# Patient Record
Sex: Female | Born: 1975 | Race: White | Hispanic: No | Marital: Married | Smoking: Former smoker
Health system: Southern US, Community
[De-identification: ages and names within clinical notes are randomized; demographics above are authoritative.]

## PROBLEM LIST (undated history)

## (undated) DIAGNOSIS — G43909 Migraine, unspecified, not intractable, without status migrainosus: Secondary | ICD-10-CM

## (undated) DIAGNOSIS — F419 Anxiety disorder, unspecified: Secondary | ICD-10-CM

## (undated) DIAGNOSIS — T7840XA Allergy, unspecified, initial encounter: Secondary | ICD-10-CM

## (undated) DIAGNOSIS — I499 Cardiac arrhythmia, unspecified: Secondary | ICD-10-CM

## (undated) HISTORY — PX: NO PAST SURGERIES: SHX2092

## (undated) HISTORY — DX: Allergy, unspecified, initial encounter: T78.40XA

## (undated) HISTORY — DX: Migraine, unspecified, not intractable, without status migrainosus: G43.909

## (undated) HISTORY — DX: Cardiac arrhythmia, unspecified: I49.9

## (undated) HISTORY — DX: Anxiety disorder, unspecified: F41.9

---

## 2019-07-09 DIAGNOSIS — Z111 Encounter for screening for respiratory tuberculosis: Secondary | ICD-10-CM | POA: Diagnosis not present

## 2019-07-11 DIAGNOSIS — Z111 Encounter for screening for respiratory tuberculosis: Secondary | ICD-10-CM | POA: Diagnosis not present

## 2019-07-24 ENCOUNTER — Other Ambulatory Visit: Payer: Self-pay | Admitting: Occupational Medicine

## 2019-07-24 ENCOUNTER — Other Ambulatory Visit: Payer: Self-pay

## 2019-07-24 ENCOUNTER — Ambulatory Visit: Payer: Self-pay

## 2019-07-24 DIAGNOSIS — Z0000 Encounter for general adult medical examination without abnormal findings: Secondary | ICD-10-CM

## 2019-07-24 LAB — BASIC METABOLIC PANEL
BUN: 12 (ref 4–21)
CO2: 24 — AB (ref 13–22)
Chloride: 107 (ref 99–108)
Creatinine: 0.7 (ref <=1.1)
Potassium: 4.3 (ref 3.4–5.3)
Sodium: 139 (ref 137–147)

## 2019-07-24 LAB — CBC AND DIFFERENTIAL
HCT: 38 (ref 36–46)
Hemoglobin: 12.6 (ref 12.0–16.0)
Neutrophils Absolute: 4866
Platelets: 284 (ref 150–399)
WBC: 7.9

## 2019-07-24 LAB — LIPID PANEL
Cholesterol: 110 (ref 0–200)
HDL: 52 (ref 35–70)
LDL Cholesterol: 46
Triglycerides: 50 (ref 40–160)

## 2019-07-24 LAB — COMPREHENSIVE METABOLIC PANEL
Albumin: 4.4 (ref 3.5–5.0)
Calcium: 9 (ref 8.7–10.7)
GFR calc Af Amer: 124
GFR calc non Af Amer: 107
Globulin: 2.6

## 2019-07-24 LAB — CBC: RBC: 4.41 (ref 3.87–5.11)

## 2019-09-15 DIAGNOSIS — Z20828 Contact with and (suspected) exposure to other viral communicable diseases: Secondary | ICD-10-CM | POA: Diagnosis not present

## 2019-09-25 ENCOUNTER — Ambulatory Visit (INDEPENDENT_AMBULATORY_CARE_PROVIDER_SITE_OTHER): Payer: BC Managed Care – PPO | Admitting: Family Medicine

## 2019-09-25 ENCOUNTER — Other Ambulatory Visit: Payer: Self-pay

## 2019-09-25 NOTE — Progress Notes (Deleted)
{Method of visit:23308}   Patient: Melissa Ho, Female    DOB: [birth year], 43 y.o.   MRN: [id number] Visit Date: 09/25/2019  Today's Provider: Lavon Paganini, MD   Chief Complaint  Patient presents with  . Establish Care   Subjective:     New patient establishing care: Melissa Ho is a 43 y.o. female who presents today to establish care. She feels fairly well. She reports exercising ***. She reports she is sleeping {DESC; WELL/FAIRLY WELL/POORLY:18703}.  -----------------------------------------------------------------   Review of Systems  Constitutional: Negative for chills, fatigue and fever.  HENT: Negative for congestion, ear pain, rhinorrhea, sneezing and sore throat.   Eyes: Negative.  Negative for pain and redness.  Respiratory: Negative for cough, shortness of breath and wheezing.   Cardiovascular: Negative for chest pain and leg swelling.  Gastrointestinal: Negative for abdominal pain, blood in stool, constipation, diarrhea and nausea.  Endocrine: Negative for polydipsia and polyphagia.  Genitourinary: Negative.  Negative for dysuria, flank pain, hematuria, pelvic pain, vaginal bleeding and vaginal discharge.  Musculoskeletal: Negative for arthralgias, back pain, gait problem and joint swelling.  Skin: Negative for rash.  Neurological: Negative.  Negative for dizziness, tremors, seizures, weakness, light-headedness, numbness and headaches.  Hematological: Negative for adenopathy.  Psychiatric/Behavioral: Negative.  Negative for behavioral problems, confusion and dysphoric mood. The patient is not nervous/anxious and is not hyperactive.     Social History      She         Social History   Socioeconomic History  . Marital status: Married    Spouse name: Not on file  . Number of children: Not on file  . Years of education: Not on file  . Highest education level: Not on file  Occupational History  . Not on file  Social Needs  . Financial resource  strain: Not on file  . Food insecurity    Worry: Not on file    Inability: Not on file  . Transportation needs    Medical: Not on file    Non-medical: Not on file  Tobacco Use  . Smoking status: Not on file  Substance and Sexual Activity  . Alcohol use: Not on file  . Drug use: Not on file  . Sexual activity: Not on file  Lifestyle  . Physical activity    Days per week: Not on file    Minutes per session: Not on file  . Stress: Not on file  Relationships  . Social Herbalist on phone: Not on file    Gets together: Not on file    Attends religious service: Not on file    Active member of club or organization: Not on file    Attends meetings of clubs or organizations: Not on file    Relationship status: Not on file  Other Topics Concern  . Not on file  Social History Narrative  . Not on file    No past medical history on file.   There are no active problems to display for this patient.   *** The histories are not reviewed yet. Please review them in the "History" navigator section and refresh this [place].  Family History        No family status information on file.        Her family history is not on file.      Not on File  No current outpatient medications on file.   Patient Care Team: Virginia Crews, MD as PCP -  General (Family Medicine)    Objective:    Vitals: There were no vitals taken for this visit.  There were no vitals filed for this visit.   Physical Exam   Depression Screen No flowsheet data found.     Assessment & Plan:     Routine Health Maintenance and Physical Exam  Exercise Activities and Dietary recommendations Goals   None      There is no immunization history on file for this patient.  Health Maintenance  Topic Date Due  . HIV Screening  12/17/1991  . TETANUS/TDAP  12/17/1995  . PAP SMEAR-Modifier  12/16/1997  . INFLUENZA VACCINE  06/07/2019     Discussed health benefits of physical activity,  and encouraged her to engage in regular exercise appropriate for her age and condition.    --------------------------------------------------------------------    Shirlee Latch, MD  [facility]

## 2019-09-26 NOTE — Progress Notes (Signed)
Patient rescheduled as she had COVID-like symptoms. Not seen today

## 2019-10-06 ENCOUNTER — Ambulatory Visit (INDEPENDENT_AMBULATORY_CARE_PROVIDER_SITE_OTHER): Payer: BC Managed Care – PPO | Admitting: Family Medicine

## 2019-10-06 ENCOUNTER — Encounter: Payer: Self-pay | Admitting: Family Medicine

## 2019-10-06 ENCOUNTER — Other Ambulatory Visit: Payer: Self-pay

## 2019-10-06 VITALS — BP 111/75 | HR 64 | Temp 97.3°F | Resp 16 | Ht 67.0 in | Wt 192.0 lb

## 2019-10-06 DIAGNOSIS — F411 Generalized anxiety disorder: Secondary | ICD-10-CM

## 2019-10-06 DIAGNOSIS — R911 Solitary pulmonary nodule: Secondary | ICD-10-CM | POA: Insufficient documentation

## 2019-10-06 DIAGNOSIS — Z1231 Encounter for screening mammogram for malignant neoplasm of breast: Secondary | ICD-10-CM | POA: Diagnosis not present

## 2019-10-06 DIAGNOSIS — Z23 Encounter for immunization: Secondary | ICD-10-CM

## 2019-10-06 DIAGNOSIS — J301 Allergic rhinitis due to pollen: Secondary | ICD-10-CM

## 2019-10-06 DIAGNOSIS — E669 Obesity, unspecified: Secondary | ICD-10-CM | POA: Insufficient documentation

## 2019-10-06 DIAGNOSIS — R002 Palpitations: Secondary | ICD-10-CM

## 2019-10-06 DIAGNOSIS — Z803 Family history of malignant neoplasm of breast: Secondary | ICD-10-CM

## 2019-10-06 DIAGNOSIS — E785 Hyperlipidemia, unspecified: Secondary | ICD-10-CM

## 2019-10-06 DIAGNOSIS — G4700 Insomnia, unspecified: Secondary | ICD-10-CM

## 2019-10-06 DIAGNOSIS — Z6830 Body mass index (BMI) 30.0-30.9, adult: Secondary | ICD-10-CM

## 2019-10-06 DIAGNOSIS — G43709 Chronic migraine without aura, not intractable, without status migrainosus: Secondary | ICD-10-CM

## 2019-10-06 HISTORY — DX: Solitary pulmonary nodule: R91.1

## 2019-10-06 MED ORDER — ZOLPIDEM TARTRATE 5 MG PO TABS: 2.5000 mg | ORAL_TABLET | Freq: Every evening | ORAL | 5 refills | Status: DC | PRN

## 2019-10-06 NOTE — Assessment & Plan Note (Signed)
Chronic and stable Continue Flonase and Allegra Discussed risks of rebound congestion with long-term decongestant use and advised using plain Allegra and not Allegra-D

## 2019-10-06 NOTE — Assessment & Plan Note (Signed)
Chronic and well-controlled Continue low-dose Celexa 10 mg daily When her stress stabilizes, we will attempt to taper off in the future

## 2019-10-06 NOTE — Assessment & Plan Note (Signed)
Reviewed recent lipid panel which showed good control Continue atorvastatin 40 mg daily, especially in the setting of her father's early cardiac history

## 2019-10-06 NOTE — Assessment & Plan Note (Signed)
Chronic and fairly well controlled Discussed side effects and risks of long-term Ambien use She will continue to try to wean and we will try to stop in the future when stress has settled down

## 2019-10-06 NOTE — Assessment & Plan Note (Signed)
Discussed importance of healthy weight management Discussed diet and exercise  

## 2019-10-06 NOTE — Assessment & Plan Note (Signed)
Chronic and intermittent Unclear what arrhythmia was seen on her EKG, but will request records from previous PCP Patient has had a benign stress test and cardiac evaluation Continue propranolol

## 2019-10-06 NOTE — Progress Notes (Signed)
Patient: Melissa Ho, Female    DOB: 07-26-1976, 43 y.o.   MRN: [id number] Visit Date: 10/06/2019  Today's Provider: Lavon Paganini, MD   Chief Complaint  Patient presents with   New Patient (Initial Visit)   Subjective:     Establish care Melissa Ho is a 43 y.o. female who presents today to establish care and for health maintenance . She feels well. She reports she is sleeping fairly well. Patient reports she moved from the state of [place] in July of this year. Patient reports last pap was done in 06/2018 and reports it was normal. Patient reports Tdap was given in March of 2020 due to cat scratch. Patient reports last mammogram was done 06/2018.  -----------------------------------------------------------------  Patient's mother had breast cancer at age 45.  Patient has had negative BRCA testing.  She does get mammograms annually and is due for one now.  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  She was noted to have a 1.5 cm nodule in her left lower lobe on chest x-ray incidentally.  She does not remember having a previous chest x-ray before this.  She has not had any follow-up for this.  Migraines: Since starting propranolol for migraine prophylaxis, she has had significant decrease in her migraine symptoms.  Is been more than a year since her last migraine  History of irregular heartbeat/palpitations.  She reports it was caught on EKG by her previous PCP.  She was evaluated by cardiology and had a stress test and was told that it was benign.  Propranolol also helps with this  Allergic rhinitis-taking Flonase and Allegra-D regularly with good control of symptoms.  She is trying to get off of Allegra-D because she knows about the rebound congestion issue  Anxiety: Patient is taking Celexa 10 mg daily.  She would like to wean off of this.  She is feeling balance currently.  She is hesitant to wean at this  time due to the stress due to the current pandemic  Insomnia: Patient has been taking Ambien nightly chronically.  She is weaned down to 2.5 mg nightly.  She states she has no trouble falling asleep, but difficulty staying asleep.  She is hoping to wean off of her Ambien in the future as well.  She does not feel like this is the right time to do it, however, due to the stress of the current pandemic.   Review of Systems  Constitutional: Negative.   HENT: Negative.   Eyes: Negative.   Respiratory: Negative.   Cardiovascular: Negative.   Gastrointestinal: Negative.   Endocrine: Negative.   Genitourinary: Negative.   Musculoskeletal: Positive for back pain.  Skin: Negative.   Allergic/Immunologic: Positive for environmental allergies.  Neurological: Negative.   Hematological: Negative.   Psychiatric/Behavioral: Negative.     Social History      She  reports that she quit smoking about 9 years ago. Her smoking use included cigarettes. She has never used smokeless tobacco. She reports current alcohol use of about 4.0 standard drinks of alcohol per week. She reports that she does not use drugs.       Social History   Socioeconomic History   Marital status: Married    Spouse name: Not on file   Number of children: Not on file   Years of education: Not on file   Highest education level: Not on file  Occupational History   Not on  file  Social Needs   Financial resource strain: Not on file   Food insecurity    Worry: Not on file    Inability: Not on file   Transportation needs    Medical: Not on file    Non-medical: Not on file  Tobacco Use   Smoking status: Former Smoker    Types: Cigarettes    Quit date: 09/06/2010    Years since quitting: 9.0   Smokeless tobacco: Never Used  Substance and Sexual Activity   Alcohol use: Yes    Alcohol/week: 4.0 standard drinks    Types: 4 Glasses of wine per week   Drug use: Never   Sexual activity: Yes  Lifestyle    Physical activity    Days per week: Not on file    Minutes per session: Not on file   Stress: Not on file  Relationships   Social connections    Talks on phone: Not on file    Gets together: Not on file    Attends religious service: Not on file    Active member of club or organization: Not on file    Attends meetings of clubs or organizations: Not on file    Relationship status: Not on file  Other Topics Concern   Not on file  Social History Narrative   Not on file    Past Medical History:  Diagnosis Date   Allergy    Anxiety    Irregular heartbeat    Migraine      There are no active problems to display for this patient.   History reviewed. No pertinent surgical history.  Family History        Family Status  Relation Name Status   Mother  Alive   Father  Alive   Brother  Alive        Her family history includes Breast cancer in her mother; Heart attack in her father; Stroke in her father.      No Known Allergies   Current Outpatient Medications:    atorvastatin (LIPITOR) 40 MG tablet, Take 40 mg by mouth at bedtime., Disp: , Rfl:    citalopram (CELEXA) 10 MG tablet, Take 10 mg by mouth daily., Disp: , Rfl:    fexofenadine (ALLEGRA) 180 MG tablet, Take 180 mg by mouth as needed for allergies or rhinitis., Disp: , Rfl:    fluticasone (FLONASE) 50 MCG/ACT nasal spray, Place 1 spray into both nostrils as needed for allergies or rhinitis., Disp: , Rfl:    propranolol (INDERAL) 10 MG tablet, Take 10 mg by mouth daily., Disp: , Rfl:    trimethoprim (TRIMPEX) 100 MG tablet, Take 100 mg by mouth as needed (after intercourse)., Disp: , Rfl:    zolpidem (AMBIEN) 5 MG tablet, Take 2.5 mg by mouth at bedtime as needed., Disp: , Rfl:    Patient Care Team: Virginia Crews, MD as PCP - General (Family Medicine)    Objective:    Vitals: Temp (!) 97.3 F (36.3 C) (Temporal)    Resp 16    Ht _0  (1.702 m)    Wt 192 lb (87.1 kg)    BMI 30.07 kg/m      Vitals:   10/06/19 1553  Resp: 16  Temp: (!) 97.3 F (36.3 C)  TempSrc: Temporal  Weight: 192 lb (87.1 kg)  Height: _1  (1.702 m)     Physical Exam Vitals signs reviewed.  Constitutional:      General: She is not in  acute distress.    Appearance: Normal appearance. She is well-developed. She is not diaphoretic.  HENT:     Head: Normocephalic and atraumatic.     Right Ear: Tympanic membrane, ear canal and external ear normal.     Left Ear: Tympanic membrane, ear canal and external ear normal.  Eyes:     General: No scleral icterus.    Conjunctiva/sclera: Conjunctivae normal.     Pupils: Pupils are equal, round, and reactive to light.  Neck:     Musculoskeletal: Neck supple.     Thyroid: No thyromegaly.  Cardiovascular:     Rate and Rhythm: Normal rate and regular rhythm.     Pulses: Normal pulses.     Heart sounds: Normal heart sounds. No murmur.  Pulmonary:     Effort: Pulmonary effort is normal. No respiratory distress.     Breath sounds: Normal breath sounds. No wheezing or rales.  Abdominal:     General: There is no distension.     Palpations: Abdomen is soft.     Tenderness: There is no abdominal tenderness.  Musculoskeletal:        General: No deformity.     Right lower leg: No edema.     Left lower leg: No edema.  Lymphadenopathy:     Cervical: No cervical adenopathy.  Skin:    General: Skin is warm and dry.     Capillary Refill: Capillary refill takes less than 2 seconds.     Findings: No rash.  Neurological:     Mental Status: She is alert and oriented to person, place, and time. Mental status is at baseline.  Psychiatric:        Mood and Affect: Mood normal.        Behavior: Behavior normal.        Thought Content: Thought content normal.      Depression Screen PHQ 2/9 Scores 10/06/2019  PHQ - 2 Score 0  PHQ- 9 Score 3       GAD 7 : Generalized Anxiety Score 10/06/2019 10/06/2019  Nervous, Anxious, on Edge 0 0  Control/stop worrying 0  0  Worry too much - different things 1 3  Trouble relaxing 0 0  Restless 0 0  Easily annoyed or irritable 0 0  Afraid - awful might happen 0 0  Total GAD 7 Score 1 3  Anxiety Difficulty Not difficult at all Not difficult at all      Assessment & Plan:     Establish care  Exercise Activities and Dietary recommendations Goals   None      There is no immunization history on file for this patient.  Health Maintenance  Topic Date Due   HIV Screening  12/17/1991   TETANUS/TDAP  12/17/1995   PAP SMEAR-Modifier  12/16/1997   INFLUENZA VACCINE  06/07/2019     Discussed health benefits of physical activity, and encouraged her to engage in regular exercise appropriate for her age and condition.    Personally reviewed recent chest x-ray and lab results  ROI sent to previous PCP for last labs, vaccines, and health maintenance --------------------------------------------------------------------  Problem List Items Addressed This Visit      Cardiovascular and Mediastinum   Chronic migraine without aura without status migrainosus, not intractable    Chronic Stable and well-controlled Continue propranolol for prophylaxis      Relevant Medications   atorvastatin (LIPITOR) 40 MG tablet   citalopram (CELEXA) 10 MG tablet   propranolol (INDERAL) 10 MG tablet  Respiratory   Seasonal allergic rhinitis due to pollen    Chronic and stable Continue Flonase and Allegra Discussed risks of rebound congestion with long-term decongestant use and advised using plain Allegra and not Allegra-D        Other   Solitary pulmonary nodule - Primary    Noted incidentally on a chest x-ray in 07/2019 Patient does have 8-pack-year smoking history and quit in 2011 Unclear of the age of this nodule she has no previous chest x-rays to compare Will obtain chest CT to further evaluate      Relevant Orders   CT CHEST NODULE FOLLOW UP LOW DOSE W/O   Insomnia    Chronic and fairly well  controlled Discussed side effects and risks of long-term Ambien use She will continue to try to wean and we will try to stop in the future when stress has settled down      GAD (generalized anxiety disorder)    Chronic and well-controlled Continue low-dose Celexa 10 mg daily When her stress stabilizes, we will attempt to taper off in the future      Relevant Medications   citalopram (CELEXA) 10 MG tablet   Hyperlipidemia    Reviewed recent lipid panel which showed good control Continue atorvastatin 40 mg daily, especially in the setting of her father's early cardiac history      Relevant Medications   atorvastatin (LIPITOR) 40 MG tablet   propranolol (INDERAL) 10 MG tablet   Family history of breast cancer    Family history of early breast cancer in her mother Continue annual mammogram screening Patient is BRCA negative      Relevant Orders   MM 3D SCREEN BREAST BILATERAL   Palpitations    Chronic and intermittent Unclear what arrhythmia was seen on her EKG, but will request records from previous PCP Patient has had a benign stress test and cardiac evaluation Continue propranolol      Obesity    Discussed importance of healthy weight management Discussed diet and exercise        Other Visit Diagnoses    Encounter for screening mammogram for malignant neoplasm of breast       Relevant Orders   MM 3D SCREEN BREAST BILATERAL   Need for influenza vaccination       Relevant Orders   Flu Vaccine QUAD 36+ mos IM (Completed)       Return in about 6 months (around 04/04/2020) for chronic disease f/u.   The entirety of the information documented in the History of Present Illness, Review of Systems and Physical Exam were personally obtained by me. Portions of this information were initially documented by Lynford Humphrey, CMA and reviewed by me for thoroughness and accuracy.    Ashok Sawaya, Dionne Bucy, MD MPH [facility]

## 2019-10-06 NOTE — Assessment & Plan Note (Signed)
Noted incidentally on a chest x-ray in 07/2019 Patient does have 8-pack-year smoking history and quit in 2011 Unclear of the age of this nodule she has no previous chest x-rays to compare Will obtain chest CT to further evaluate

## 2019-10-06 NOTE — Assessment & Plan Note (Signed)
Family history of early breast cancer in her mother Continue annual mammogram screening Patient is BRCA negative 

## 2019-10-06 NOTE — Assessment & Plan Note (Signed)
Chronic Stable and well-controlled Continue propranolol for prophylaxis

## 2019-10-13 ENCOUNTER — Ambulatory Visit
Admission: RE | Admit: 2019-10-13 | Discharge: 2019-10-13 | Disposition: A | Discharge status: Home / Self Care | Payer: BC Managed Care – PPO | Source: Ambulatory Visit | Attending: Family Medicine | Admitting: Family Medicine

## 2019-10-13 DIAGNOSIS — Z803 Family history of malignant neoplasm of breast: Secondary | ICD-10-CM | POA: Diagnosis not present

## 2019-10-13 DIAGNOSIS — Z1231 Encounter for screening mammogram for malignant neoplasm of breast: Secondary | ICD-10-CM

## 2019-10-21 ENCOUNTER — Telehealth: Payer: Self-pay

## 2019-10-21 ENCOUNTER — Other Ambulatory Visit: Payer: Self-pay

## 2019-10-21 ENCOUNTER — Ambulatory Visit
Admission: RE | Admit: 2019-10-21 | Discharge: 2019-10-21 | Disposition: A | Discharge status: Home / Self Care | Payer: BC Managed Care – PPO | Source: Ambulatory Visit | Attending: Family Medicine | Admitting: Family Medicine

## 2019-10-21 DIAGNOSIS — R911 Solitary pulmonary nodule: Secondary | ICD-10-CM | POA: Diagnosis not present

## 2019-10-21 NOTE — Telephone Encounter (Signed)
-----   Message from Virginia Crews, MD sent at 10/21/2019 11:44 AM EST ----- L sided pulmonary nodule is a granuloma.  There is nothing concerning for a malignant process.  No follow-up imaging is needed.

## 2019-10-21 NOTE — Telephone Encounter (Signed)
Patient advised as below.  

## 2019-10-24 ENCOUNTER — Telehealth: Payer: Self-pay

## 2019-10-24 NOTE — Telephone Encounter (Signed)
Left message advising pt.  (Per DPR)  Thanks,   -Chala Gul  

## 2019-10-24 NOTE — Telephone Encounter (Signed)
-----   Message from Virginia Crews, MD sent at 10/24/2019  9:09 AM EST ----- Normal mammogram. Repeat in 1 yr

## 2020-04-12 ENCOUNTER — Ambulatory Visit (INDEPENDENT_AMBULATORY_CARE_PROVIDER_SITE_OTHER): Payer: BC Managed Care – PPO | Admitting: Family Medicine

## 2020-04-12 ENCOUNTER — Other Ambulatory Visit: Payer: Self-pay

## 2020-04-12 ENCOUNTER — Encounter: Payer: Self-pay | Admitting: Family Medicine

## 2020-04-12 VITALS — BP 116/68 | HR 64 | Temp 97.3°F | Wt 198.0 lb

## 2020-04-12 DIAGNOSIS — G4700 Insomnia, unspecified: Secondary | ICD-10-CM

## 2020-04-12 DIAGNOSIS — E785 Hyperlipidemia, unspecified: Secondary | ICD-10-CM

## 2020-04-12 DIAGNOSIS — G43709 Chronic migraine without aura, not intractable, without status migrainosus: Secondary | ICD-10-CM | POA: Diagnosis not present

## 2020-04-12 DIAGNOSIS — F411 Generalized anxiety disorder: Secondary | ICD-10-CM

## 2020-04-12 DIAGNOSIS — M25531 Pain in right wrist: Secondary | ICD-10-CM | POA: Insufficient documentation

## 2020-04-12 MED ORDER — ZOLPIDEM TARTRATE 5 MG PO TABS: 2.5000 mg | ORAL_TABLET | Freq: Every evening | ORAL | 5 refills | Status: DC | PRN

## 2020-04-12 MED ORDER — CITALOPRAM HYDROBROMIDE 20 MG PO TABS: 20.0000 mg | ORAL_TABLET | Freq: Every day | ORAL | 1 refills | Status: DC

## 2020-04-12 NOTE — Patient Instructions (Signed)
Carpal Tunnel Syndrome  Carpal tunnel syndrome is a condition that causes pain in your hand and arm. The carpal tunnel is a narrow area that is on the palm side of your wrist. Repeated wrist motion or certain diseases may cause swelling in the tunnel. This swelling can pinch the main nerve in the wrist (median nerve). What are the causes? This condition may be caused by:  Repeated wrist motions.  Wrist injuries.  Arthritis.  A sac of fluid (cyst) or abnormal growth (tumor) in the carpal tunnel.  Fluid buildup during pregnancy. Sometimes the cause is not known. What increases the risk? The following factors may make you more likely to develop this condition:  Having a job in which you move your wrist in the same way many times. This includes jobs like being a butcher or a cashier.  Being a woman.  Having other health conditions, such as: ? Diabetes. ? Obesity. ? A thyroid gland that is not active enough (hypothyroidism). ? Kidney failure. What are the signs or symptoms? Symptoms of this condition include:  A tingling feeling in your fingers.  Tingling or a loss of feeling (numbness) in your hand.  Pain in your entire arm. This pain may get worse when you bend your wrist and elbow for a long time.  Pain in your wrist that goes up your arm to your shoulder.  Pain that goes down into your palm or fingers.  A weak feeling in your hands. You may find it hard to grab and hold items. You may feel worse at night. How is this diagnosed? This condition is diagnosed with a medical history and physical exam. You may also have tests, such as:  Electromyogram (EMG). This test checks the signals that the nerves send to the muscles.  Nerve conduction study. This test checks how well signals pass through your nerves.  Imaging tests, such as X-rays, ultrasound, and MRI. These tests check for what might be the cause of your condition. How is this treated? This condition may be treated  with:  Lifestyle changes. You will be asked to stop or change the activity that caused your problem.  Doing exercise and activities that make bones and muscles stronger (physical therapy).  Learning how to use your hand again (occupational therapy).  Medicines for pain and swelling (inflammation). You may have injections in your wrist.  A wrist splint.  Surgery. Follow these instructions at home: If you have a splint:  Wear the splint as told by your doctor. Remove it only as told by your doctor.  Loosen the splint if your fingers: ? Tingle. ? Lose feeling (become numb). ? Turn cold and blue.  Keep the splint clean.  If the splint is not waterproof: ? Do not let it get wet. ? Cover it with a watertight covering when you take a bath or a shower. Managing pain, stiffness, and swelling   If told, put ice on the painful area: ? If you have a removable splint, remove it as told by your doctor. ? Put ice in a plastic bag. ? Place a towel between your skin and the bag. ? Leave the ice on for 20 minutes, 2-3 times per day. General instructions  Take over-the-counter and prescription medicines only as told by your doctor.  Rest your wrist from any activity that may cause pain. If needed, talk with your boss at work about changes that can help your wrist heal.  Do any exercises as told by your doctor,   physical therapist, or occupational therapist.  Keep all follow-up visits as told by your doctor. This is important. Contact a doctor if:  You have new symptoms.  Medicine does not help your pain.  Your symptoms get worse. Get help right away if:  You have very bad numbness or tingling in your wrist or hand. Summary  Carpal tunnel syndrome is a condition that causes pain in your hand and arm.  It is often caused by repeated wrist motions.  Lifestyle changes and medicines are used to treat this problem. Surgery may help in very bad cases.  Follow your doctor's  instructions about wearing a splint, resting your wrist, keeping follow-up visits, and calling for help. This information is not intended to replace advice given to you by your health care provider. Make sure you discuss any questions you have with your health care provider. Document Revised: 03/01/2018 Document Reviewed: 03/01/2018 Elsevier Patient Education  2020 [employer]  

## 2020-04-12 NOTE — Assessment & Plan Note (Signed)
Previously well controlled Continue statin Goal LDL < 100  

## 2020-04-12 NOTE — Assessment & Plan Note (Signed)
Chronic and stable.  Pt only reports one migraine since last visit, and it was allergy related.  Continue current medications.

## 2020-04-12 NOTE — Assessment & Plan Note (Addendum)
New problem. Likely secondary to Carpel Tunnel.  Pt reports bracing at bedtime helps significantly. Recommended icing at night, and Ibuprofen if needed. Advised pt to call if worsening or not improved.  Will refer to Ortho at that time.

## 2020-04-12 NOTE — Assessment & Plan Note (Signed)
Well controlled.  Pt continues to take Ambien 2.5mg  at bedtime.  Will try to wean off when anxiety is under control.

## 2020-04-12 NOTE — Assessment & Plan Note (Addendum)
Chronic and uncontrolled.  Pt reports having two panic attacks in the last few weeks.  Will increase Celexa to 20mg  a day.  Recheck in three months at her physical.

## 2020-04-12 NOTE — Progress Notes (Signed)
I,Laura E Walsh,acting as a scribe for Shirlee Latch, MD.,have documented all relevant documentation on the behalf of Shirlee Latch, MD,as directed by  Shirlee Latch, MD while in the presence of Shirlee Latch, MD.   Established patient visit   Patient: Melissa Ho   DOB: 01/08/1976   44 y.o. Female  MRN: [id number] Visit Date: 04/12/2020  Today's healthcare provider: Shirlee Latch, MD   Chief Complaint  Patient presents with  . Hyperlipidemia  . Insomnia  . Anxiety  . Wrist Pain    Right wrist.   Subjective    HPI   Lipid/Cholesterol, Follow-up  Last lipid panel Other pertinent labs  Lab Results  Component Value Date   CHOL 110 07/24/2019   HDL 52 07/24/2019   LDLCALC 46 07/24/2019   TRIG 50 07/24/2019   Lab Results  Component Value Date   PLT 284 07/24/2019     She was last seen for this 6 months ago.  Management since that visit includes no changes.  She reports excellent compliance with treatment. She is not having side effects.   Symptoms: No chest pain No chest pressure/discomfort  No dyspnea No lower extremity edema  No numbness or tingling of extremity No orthopnea  No palpitations No paroxysmal nocturnal dyspnea  No speech difficulty No syncope   Current diet: in general, a "healthy" diet    The ASCVD Risk score Denman George DC Jr., et al., 2013) failed to calculate for the following reasons:   The valid total cholesterol range is 130 to 320 mg/dL  --------------------------------------------------------------------------------------------------- Anxiety, Follow-up  She was last seen for anxiety 6 months ago. Changes made at last visit include no changes.   She reports excellent compliance with treatment. She reports excellent tolerance of treatment. She is not having side effects.   She feels her anxiety is Worse since last visit. Pt reports having a two panic attacks in the last two weeks.    Symptoms: No chest pain No  difficulty concentrating  No dizziness No fatigue  No feelings of losing control No insomnia  No irritable No palpitations  Yes panic attacks No racing thoughts  No shortness of breath No sweating  No tremors/shakes    GAD-7 Results GAD-7 Generalized Anxiety Disorder Screening Tool 04/12/2020 10/06/2019 10/06/2019  1. Feeling Nervous, Anxious, or on Edge 1 0 0  2. Not Being Able to Stop or Control Worrying 1 0 0  3. Worrying Too Much About Different Things 1 1 3   4. Trouble Relaxing 0 0 0  5. Being So Restless it's Hard To Sit Still 0 0 0  6. Becoming Easily Annoyed or Irritable 1 0 0  7. Feeling Afraid As If Something Awful Might Happen 0 0 0  Total GAD-7 Score 4 1 3   Difficulty At Work, Home, or Getting  Along With Others? Somewhat difficult Not difficult at all Not difficult at all    PHQ-9 Scores PHQ9 SCORE ONLY 04/12/2020 10/06/2019  PHQ-9 Total Score 3 3     R hand with intermittent numbness and pain.  Has family h/o carpal tunnel syndrome and started night bracing which is helping. ---------------------------------------------------------------------------------------------------  Patient Active Problem List   Diagnosis Date Noted  . Right wrist pain 04/12/2020  . Solitary pulmonary nodule 10/06/2019  . Insomnia 10/06/2019  . GAD (generalized anxiety disorder) 10/06/2019  . Hyperlipidemia 10/06/2019  . Family history of breast cancer 10/06/2019  . Seasonal allergic rhinitis due to pollen 10/06/2019  . Chronic migraine without aura without  status migrainosus, not intractable 10/06/2019  . Palpitations 10/06/2019  . Obesity 10/06/2019   Past Medical History:  Diagnosis Date  . Allergy   . Anxiety   . Irregular heartbeat   . Migraine    Social History   Tobacco Use  . Smoking status: Former Smoker    Packs/day: 0.50    Years: 16.00    Pack years: 8.00    Types: Cigarettes    Quit date: 09/06/2010    Years since quitting: 9.6  . Smokeless tobacco: Never Used    Substance Use Topics  . Alcohol use: Yes    Alcohol/week: 4.0 standard drinks    Types: 4 Glasses of wine per week  . Drug use: Never   No Known Allergies   Medications: Outpatient Medications Prior to Visit  Medication Sig  . atorvastatin (LIPITOR) 40 MG tablet Take 40 mg by mouth at bedtime.  . fluticasone (FLONASE) 50 MCG/ACT nasal spray Place 1 spray into both nostrils as needed for allergies or rhinitis.  Marland Kitchen propranolol (INDERAL) 10 MG tablet Take 10 mg by mouth daily.  Marland Kitchen trimethoprim (TRIMPEX) 100 MG tablet Take 100 mg by mouth as needed (after intercourse).  Marland Kitchen zolpidem (AMBIEN) 5 MG tablet Take 0.5 tablets (2.5 mg total) by mouth at bedtime as needed.  . [DISCONTINUED] citalopram (CELEXA) 10 MG tablet Take 10 mg by mouth daily.  . fexofenadine (ALLEGRA) 180 MG tablet Take 180 mg by mouth as needed for allergies or rhinitis.   No facility-administered medications prior to visit.    Review of Systems  Constitutional: Negative.   Respiratory: Negative.   Cardiovascular: Negative.   Gastrointestinal: Negative.   Musculoskeletal: Positive for arthralgias (Right wrist pain). Negative for back pain, gait problem, joint swelling, myalgias, neck pain and neck stiffness.  Neurological: Negative for dizziness, light-headedness and headaches.  Psychiatric/Behavioral: Negative for decreased concentration, dysphoric mood, self-injury, sleep disturbance and suicidal ideas. The patient is not nervous/anxious.     Last metabolic panel Lab Results  Component Value Date   NA 139 07/24/2019   K 4.3 07/24/2019   CL 107 07/24/2019   CO2 24 (A) 07/24/2019   BUN 12 07/24/2019   CREATININE 0.7 07/24/2019   GFRNONAA 107 07/24/2019   GFRAA 124 07/24/2019   CALCIUM 9.0 07/24/2019   ALBUMIN 4.4 07/24/2019   Last lipids Lab Results  Component Value Date   CHOL 110 07/24/2019   HDL 52 07/24/2019   LDLCALC 46 07/24/2019   TRIG 50 07/24/2019      Objective    BP 116/68 (BP Location:  Left Arm, Patient Position: Sitting, Cuff Size: Large)   Pulse 64   Temp (!) 97.3 F (36.3 C) (Temporal)   Wt 198 lb (89.8 kg)   SpO2 98%   BMI 31.01 kg/m    Physical Exam Vitals and nursing note reviewed.  Constitutional:      Appearance: Normal appearance.  HENT:     Head: Normocephalic and atraumatic.  Eyes:     Conjunctiva/sclera: Conjunctivae normal.  Cardiovascular:     Rate and Rhythm: Normal rate and regular rhythm.     Pulses: Normal pulses.     Heart sounds: Normal heart sounds. No murmur.  Pulmonary:     Effort: Pulmonary effort is normal. No respiratory distress.     Breath sounds: Normal breath sounds. No wheezing or rhonchi.  Abdominal:     Palpations: Abdomen is soft.     Tenderness: There is no abdominal tenderness.  Musculoskeletal:  Right wrist: No swelling, deformity, effusion or tenderness. Normal range of motion.     Left wrist: Normal.     Cervical back: Neck supple.     Right lower leg: No edema.     Left lower leg: No edema.     Comments: Negative Phalens and tinels  Lymphadenopathy:     Cervical: No cervical adenopathy.  Skin:    General: Skin is warm and dry.     Findings: No rash.  Neurological:     Mental Status: She is alert. Mental status is at baseline.  Psychiatric:        Mood and Affect: Mood normal.        Behavior: Behavior normal.        Thought Content: Thought content normal.       No results found for any visits on 04/12/20.  Assessment & Plan     Problem List Items Addressed This Visit      Cardiovascular and Mediastinum   Chronic migraine without aura without status migrainosus, not intractable - Primary    Chronic and stable.  Pt only reports one migraine since last visit, and it was allergy related.  Continue current medications.       Relevant Medications   citalopram (CELEXA) 20 MG tablet     Other   Insomnia    Well controlled.  Pt continues to take Ambien 2.5mg  at bedtime.  Will try to wean off  when anxiety is under control.        GAD (generalized anxiety disorder)    Chronic and uncontrolled.  Pt reports having two panic attacks in the last few weeks.  Will increase Celexa to 20mg  a day.  Recheck in three months at her physical.       Relevant Medications   citalopram (CELEXA) 20 MG tablet   Hyperlipidemia    Previously well controlled Continue statin Goal LDL < 100       Right wrist pain    New problem. Likely secondary to Carpel Tunnel.  Pt reports bracing at bedtime helps significantly. Recommended icing at night, and Ibuprofen if needed. Advised pt to call if worsening or not improved.  Will refer to Ortho at that time.            Return in about 3 months (around 07/13/2020) for CPE and Pap.      I, 09/12/2020, MD, have reviewed all documentation for this visit. The documentation on 04/12/20 for the exam, diagnosis, procedures, and orders are all accurate and complete.   Yuktha Kerchner, 06/12/20, MD, MPH [facility]

## 2020-06-14 ENCOUNTER — Telehealth: Payer: Self-pay | Admitting: Family Medicine

## 2020-07-05 ENCOUNTER — Other Ambulatory Visit: Payer: Self-pay

## 2020-07-05 ENCOUNTER — Encounter: Payer: Self-pay | Admitting: Family Medicine

## 2020-07-05 ENCOUNTER — Ambulatory Visit (INDEPENDENT_AMBULATORY_CARE_PROVIDER_SITE_OTHER): Payer: BC Managed Care – PPO | Admitting: Family Medicine

## 2020-07-05 VITALS — BP 95/65 | HR 67 | Temp 98.5°F | Ht 67.0 in | Wt 197.0 lb

## 2020-07-05 DIAGNOSIS — E785 Hyperlipidemia, unspecified: Secondary | ICD-10-CM | POA: Diagnosis not present

## 2020-07-05 DIAGNOSIS — Z0000 Encounter for general adult medical examination without abnormal findings: Secondary | ICD-10-CM | POA: Diagnosis not present

## 2020-07-05 DIAGNOSIS — Z23 Encounter for immunization: Secondary | ICD-10-CM | POA: Diagnosis not present

## 2020-07-05 DIAGNOSIS — Z6830 Body mass index (BMI) 30.0-30.9, adult: Secondary | ICD-10-CM

## 2020-07-05 DIAGNOSIS — Z1159 Encounter for screening for other viral diseases: Secondary | ICD-10-CM

## 2020-07-05 DIAGNOSIS — Z114 Encounter for screening for human immunodeficiency virus [HIV]: Secondary | ICD-10-CM

## 2020-07-05 DIAGNOSIS — N390 Urinary tract infection, site not specified: Secondary | ICD-10-CM

## 2020-07-05 DIAGNOSIS — G4700 Insomnia, unspecified: Secondary | ICD-10-CM

## 2020-07-05 DIAGNOSIS — E669 Obesity, unspecified: Secondary | ICD-10-CM

## 2020-07-05 MED ORDER — TRIMETHOPRIM 100 MG PO TABS: 100.0000 mg | ORAL_TABLET | ORAL | 3 refills | Status: DC | PRN

## 2020-07-05 MED ORDER — ATORVASTATIN CALCIUM 40 MG PO TABS: 40.0000 mg | ORAL_TABLET | Freq: Every day | ORAL | 3 refills | Status: DC

## 2020-07-05 MED ORDER — PROPRANOLOL HCL 10 MG PO TABS: 10.0000 mg | ORAL_TABLET | Freq: Every day | ORAL | 3 refills | Status: DC

## 2020-07-05 MED ORDER — ZOLPIDEM TARTRATE 5 MG PO TABS: 2.5000 mg | ORAL_TABLET | Freq: Every evening | ORAL | 5 refills | Status: DC | PRN

## 2020-07-05 NOTE — Patient Instructions (Signed)
Preventive Care 20-43 Years Old, Female Preventive care refers to visits with your health care provider and lifestyle choices that can promote health and wellness. This includes:  A yearly physical exam. This may also be called an annual well check.  Regular dental visits and eye exams.  Immunizations.  Screening for certain conditions.  Healthy lifestyle choices, such as eating a healthy diet, getting regular exercise, not using drugs or products that contain nicotine and tobacco, and limiting alcohol use. What can I expect for my preventive care visit? Physical exam Your health care provider will check your:  Height and weight. This may be used to calculate body mass index (BMI), which tells if you are at a healthy weight.  Heart rate and blood pressure.  Skin for abnormal spots. Counseling Your health care provider may ask you questions about your:  Alcohol, tobacco, and drug use.  Emotional well-being.  Home and relationship well-being.  Sexual activity.  Eating habits.  Work and work Statistician.  Method of birth control.  Menstrual cycle.  Pregnancy history. What immunizations do I need?  Influenza (flu) vaccine  This is recommended every year. Tetanus, diphtheria, and pertussis (Tdap) vaccine  You may need a Td booster every 10 years. Varicella (chickenpox) vaccine  You may need this if you have not been vaccinated. Human papillomavirus (HPV) vaccine  If recommended by your health care provider, you may need three doses over 6 months. Measles, mumps, and rubella (MMR) vaccine  You may need at least one dose of MMR. You may also need a second dose. Meningococcal conjugate (MenACWY) vaccine  One dose is recommended if you are age 75-21 years and a first-year college student living in a residence hall, or if you have one of several medical conditions. You may also need additional booster doses. Pneumococcal conjugate (PCV13) vaccine  You may need  this if you have certain conditions and were not previously vaccinated. Pneumococcal polysaccharide (PPSV23) vaccine  You may need one or two doses if you smoke cigarettes or if you have certain conditions. Hepatitis A vaccine  You may need this if you have certain conditions or if you travel or work in places where you may be exposed to hepatitis A. Hepatitis B vaccine  You may need this if you have certain conditions or if you travel or work in places where you may be exposed to hepatitis B. Haemophilus influenzae type b (Hib) vaccine  You may need this if you have certain conditions. You may receive vaccines as individual doses or as more than one vaccine together in one shot (combination vaccines). Talk with your health care provider about the risks and benefits of combination vaccines. What tests do I need?  Blood tests  Lipid and cholesterol levels. These may be checked every 5 years starting at age 33.  Hepatitis C test.  Hepatitis B test. Screening  Diabetes screening. This is done by checking your blood sugar (glucose) after you have not eaten for a while (fasting).  Sexually transmitted disease (STD) testing.  BRCA-related cancer screening. This may be done if you have a family history of breast, ovarian, tubal, or peritoneal cancers.  Pelvic exam and Pap test. This may be done every 3 years starting at age 76. Starting at age 102, this may be done every 5 years if you have a Pap test in combination with an HPV test. Talk with your health care provider about your test results, treatment options, and if necessary, the need for more tests.  Follow these instructions at home: Eating and drinking   Eat a diet that includes fresh fruits and vegetables, whole grains, lean protein, and low-fat dairy.  Take vitamin and mineral supplements as recommended by your health care provider.  Do not drink alcohol if: ? Your health care provider tells you not to drink. ? You are  pregnant, may be pregnant, or are planning to become pregnant.  If you drink alcohol: ? Limit how much you have to 0-1 drink a day. ? Be aware of how much alcohol is in your drink. In the [place] one drink equals one 12 oz bottle of beer (355 mL), one 5 oz glass of wine (148 mL), or one 1 oz glass of hard liquor (44 mL). Lifestyle  Take daily care of your teeth and gums.  Stay active. Exercise for at least 30 minutes on 5 or more days each week.  Do not use any products that contain nicotine or tobacco, such as cigarettes, e-cigarettes, and chewing tobacco. If you need help quitting, ask your health care provider.  If you are sexually active, practice safe sex. Use a condom or other form of birth control (contraception) in order to prevent pregnancy and STIs (sexually transmitted infections). If you plan to become pregnant, see your health care provider for a preconception visit. What's next?  Visit your health care provider once a year for a well check visit.  Ask your health care provider how often you should have your eyes and teeth checked.  Stay up to date on all vaccines. This information is not intended to replace advice given to you by your health care provider. Make sure you discuss any questions you have with your health care provider. Document Revised: 07/04/2018 Document Reviewed: 07/04/2018 Elsevier Patient Education  2020 [employer].

## 2020-07-05 NOTE — Progress Notes (Signed)
I,Laura E Walsh,acting as a scribe for Shirlee Latch, MD.,have documented all relevant documentation on the behalf of Shirlee Latch, MD,as directed by  Shirlee Latch, MD while in the presence of Shirlee Latch, MD.  Complete physical exam   Patient: Melissa Ho   DOB: [birth year]   44 y.o. Female  MRN: [id number] Visit Date: 07/05/2020  Today's healthcare provider: Shirlee Latch, MD   Chief Complaint  Patient presents with  . Annual Exam   Subjective    Melissa Ho is a 44 y.o. female who presents today for a complete physical exam.  She reports consuming a general diet. Exercises regularly  She generally feels well. She reports sleeping fairly well. She does not have additional problems to discuss today.  HPI    Past Medical History:  Diagnosis Date  . Allergy   . Anxiety   . Irregular heartbeat   . Migraine    Past Surgical History:  Procedure Laterality Date  . NO PAST SURGERIES     Social History   Socioeconomic History  . Marital status: Married    Spouse name: Not on file  . Number of children: 0  . Years of education: Not on file  . Highest education level: Not on file  Occupational History  . Occupation: Research officer, political party  Tobacco Use  . Smoking status: Former Smoker    Packs/day: 0.50    Years: 16.00    Pack years: 8.00    Types: Cigarettes    Quit date: 09/06/2010    Years since quitting: 9.8  . Smokeless tobacco: Never Used  Vaping Use  . Vaping Use: Never used  Substance and Sexual Activity  . Alcohol use: Yes    Alcohol/week: 4.0 standard drinks    Types: 4 Glasses of wine per week  . Drug use: Never  . Sexual activity: Yes    Partners: Male    Birth control/protection: None    Comment: husband had vasectomy  Other Topics Concern  . Not on file  Social History Narrative  . Not on file   Social Determinants of Health   Financial Resource Strain:   . Difficulty of Paying Living Expenses: Not on file  Food  Insecurity:   . Worried About Programme researcher, broadcasting/film/video in the Last Year: Not on file  . Ran Out of Food in the Last Year: Not on file  Transportation Needs:   . Lack of Transportation (Medical): Not on file  . Lack of Transportation (Non-Medical): Not on file  Physical Activity:   . Days of Exercise per Week: Not on file  . Minutes of Exercise per Session: Not on file  Stress:   . Feeling of Stress : Not on file  Social Connections:   . Frequency of Communication with Friends and Family: Not on file  . Frequency of Social Gatherings with Friends and Family: Not on file  . Attends Religious Services: Not on file  . Active Member of Clubs or Organizations: Not on file  . Attends Banker Meetings: Not on file  . Marital Status: Not on file  Intimate Partner Violence:   . Fear of Current or Ex-Partner: Not on file  . Emotionally Abused: Not on file  . Physically Abused: Not on file  . Sexually Abused: Not on file   Family Status  Relation Name Status  . Mother  Alive  . Father  Alive  . Brother  Alive  . Other  (Not Specified)  .  Cousin pat Alive  . Neg Hx  (Not Specified)   Family History  Problem Relation Age of Onset  . Breast cancer Mother 34       x's 2  . Heart attack Father        thought to be related to agent orange exposure  . Stroke Father   . Polymyalgia rheumatica Father   . Leukemia Father        LGL T-Cell  . Lung cancer Other        paternal uncle x2, smokers  . Breast cancer Cousin   . Colon cancer Neg Hx    No Known Allergies  Patient Care Team: Erasmo Downer, MD as PCP - General (Family Medicine)   Medications: Outpatient Medications Prior to Visit  Medication Sig  . citalopram (CELEXA) 20 MG tablet Take 1 tablet (20 mg total) by mouth daily.  . fluticasone (FLONASE) 50 MCG/ACT nasal spray Place 1 spray into both nostrils as needed for allergies or rhinitis.  . [DISCONTINUED] atorvastatin (LIPITOR) 40 MG tablet Take 40 mg by mouth  at bedtime.  . [DISCONTINUED] propranolol (INDERAL) 10 MG tablet Take 10 mg by mouth daily.  . [DISCONTINUED] trimethoprim (TRIMPEX) 100 MG tablet Take 100 mg by mouth as needed (after intercourse).  . [DISCONTINUED] zolpidem (AMBIEN) 5 MG tablet Take 0.5 tablets (2.5 mg total) by mouth at bedtime as needed.   No facility-administered medications prior to visit.    Review of Systems  Constitutional: Negative.   HENT: Negative.   Eyes: Negative.   Respiratory: Negative.   Cardiovascular: Negative.   Gastrointestinal: Negative.   Endocrine: Negative.   Genitourinary: Negative.   Musculoskeletal: Positive for back pain. Negative for arthralgias, gait problem, joint swelling, myalgias, neck pain and neck stiffness.  Skin: Negative.   Allergic/Immunologic: Positive for environmental allergies. Negative for food allergies and immunocompromised state.  Neurological: Negative.   Hematological: Negative.   Psychiatric/Behavioral: Negative.       Objective    BP 95/65 (BP Location: Left Arm, Patient Position: Sitting, Cuff Size: Large)   Pulse 67   Temp 98.5 F (36.9 C) (Oral)   Ht 5\' 7"  (1.702 m)   Wt 197 lb (89.4 kg)   LMP 07/05/2020   BMI 30.85 kg/m    Physical Exam Constitutional:      Appearance: Normal appearance.  HENT:     Head: Normocephalic and atraumatic.     Right Ear: Tympanic membrane, ear canal and external ear normal.     Left Ear: Tympanic membrane, ear canal and external ear normal.  Eyes:     Extraocular Movements: Extraocular movements intact.     Conjunctiva/sclera: Conjunctivae normal.     Pupils: Pupils are equal, round, and reactive to light.  Cardiovascular:     Rate and Rhythm: Normal rate and regular rhythm.     Pulses: Normal pulses.     Heart sounds: Normal heart sounds.  Pulmonary:     Effort: Pulmonary effort is normal.     Breath sounds: Normal breath sounds.  Abdominal:     Palpations: Abdomen is soft.     Tenderness: There is no  abdominal tenderness.  Musculoskeletal:        General: No tenderness.     Cervical back: Neck supple. No tenderness.     Right lower leg: No edema.     Left lower leg: No edema.  Lymphadenopathy:     Cervical: No cervical adenopathy.  Skin:    General:  Skin is warm and dry.  Neurological:     Mental Status: She is alert and oriented to person, place, and time. Mental status is at baseline.  Psychiatric:        Mood and Affect: Mood normal.        Behavior: Behavior normal.        Thought Content: Thought content normal.        Judgment: Judgment normal.       Last depression screening scores PHQ 2/9 Scores 07/05/2020 04/12/2020 10/06/2019  PHQ - 2 Score 0 0 0  PHQ- 9 Score 1 3 3    Last fall risk screening Fall Risk  07/05/2020  Falls in the past year? 0  Number falls in past yr: 0  Injury with Fall? 0  Follow up Falls evaluation completed   Last Audit-C alcohol use screening Alcohol Use Disorder Test (AUDIT) 07/05/2020  1. How often do you have a drink containing alcohol? 3  2. How many drinks containing alcohol do you have on a typical day when you are drinking? 0  3. How often do you have six or more drinks on one occasion? 0  AUDIT-C Score 3  Alcohol Brief Interventions/Follow-up -   A score of 3 or more in women, and 4 or more in men indicates increased risk for alcohol abuse, EXCEPT if all of the points are from question 1   No results found for any visits on 07/05/20.  Assessment & Plan    Routine Health Maintenance and Physical Exam  Exercise Activities and Dietary recommendations Goals   None     Immunization History  Administered Date(s) Administered  . Influenza,inj,Quad PF,6+ Mos 10/06/2019, 07/05/2020  . PFIZER SARS-COV-2 Vaccination 01/13/2020, 02/03/2020  . PPD Test 07/09/2019, 06/23/2020    Health Maintenance  Topic Date Due  . Hepatitis C Screening  Never done  . HIV Screening  Never done  . TETANUS/TDAP  Never done  . PAP SMEAR-Modifier   01/13/2002  . INFLUENZA VACCINE  Completed  . COVID-19 Vaccine  Completed    Discussed health benefits of physical activity, and encouraged her to engage in regular exercise appropriate for her age and condition.  Problem List Items Addressed This Visit      Other   Hyperlipidemia    Previously well controlled Continue statin Repeat FLP and CMP Goal LDL < 100       Relevant Medications   atorvastatin (LIPITOR) 40 MG tablet   propranolol (INDERAL) 10 MG tablet   Other Relevant Orders   Lipid panel   Comprehensive metabolic panel   CBC with Differential/Platelet   Annual physical exam - Primary   Relevant Orders   Lipid panel   HIV antibody (with reflex)   Hepatitis C Antibody   Comprehensive metabolic panel   CBC with Differential/Platelet    Other Visit Diagnoses    Encounter for screening for HIV       Relevant Orders   HIV antibody (with reflex)   Need for hepatitis C screening test       Relevant Orders   Hepatitis C Antibody   Need for influenza vaccination       Relevant Orders   Flu Vaccine QUAD 6+ mos PF IM (Fluarix Quad PF) (Completed)     ROI sent again to [facility] for vaccines and pap smear records   Return in about 6 months (around 01/03/2021) for Chronic Illnesses.     I, Shirlee LatchAngela Jondavid Schreier, MD, have reviewed all documentation for  this visit. The documentation on 07/06/20 for the exam, diagnosis, procedures, and orders are all accurate and complete.   Adaisha Campise, Marzella Schlein, MD, MPH [facility]

## 2020-07-05 NOTE — Assessment & Plan Note (Signed)
Previously well controlled Continue statin Repeat FLP and CMP Goal LDL < 100 

## 2020-07-06 DIAGNOSIS — N390 Urinary tract infection, site not specified: Secondary | ICD-10-CM | POA: Insufficient documentation

## 2020-07-06 DIAGNOSIS — Z1159 Encounter for screening for other viral diseases: Secondary | ICD-10-CM | POA: Diagnosis not present

## 2020-07-06 DIAGNOSIS — Z0000 Encounter for general adult medical examination without abnormal findings: Secondary | ICD-10-CM | POA: Diagnosis not present

## 2020-07-06 DIAGNOSIS — Z114 Encounter for screening for human immunodeficiency virus [HIV]: Secondary | ICD-10-CM | POA: Diagnosis not present

## 2020-07-06 DIAGNOSIS — E785 Hyperlipidemia, unspecified: Secondary | ICD-10-CM | POA: Diagnosis not present

## 2020-07-06 NOTE — Assessment & Plan Note (Signed)
Well controlled Continue low dose ambien prn

## 2020-07-06 NOTE — Assessment & Plan Note (Signed)
History of recurrent UTIs postcoital Now well controlled Continue trimethoprim prn postcoitally

## 2020-07-06 NOTE — Assessment & Plan Note (Signed)
Discussed importance of healthy weight management Discussed diet and exercise  

## 2020-07-07 ENCOUNTER — Telehealth: Payer: Self-pay

## 2020-07-07 LAB — CBC WITH DIFFERENTIAL/PLATELET
Basophils Absolute: 0.1 10*3/uL (ref 0.0–0.2)
Basos: 1 %
EOS (ABSOLUTE): 0.1 10*3/uL (ref 0.0–0.4)
Eos: 2 %
Hematocrit: 39.3 % (ref 34.0–46.6)
Hemoglobin: 13.3 g/dL (ref 11.1–15.9)
Immature Grans (Abs): 0 10*3/uL (ref 0.0–0.1)
Immature Granulocytes: 0 %
Lymphocytes Absolute: 1.2 10*3/uL (ref 0.7–3.1)
Lymphs: 20 %
MCH: 28.7 pg (ref 26.6–33.0)
MCHC: 33.8 g/dL (ref 31.5–35.7)
MCV: 85 fL (ref 79–97)
Monocytes Absolute: 0.5 10*3/uL (ref 0.1–0.9)
Monocytes: 8 %
Neutrophils Absolute: 4 10*3/uL (ref 1.4–7.0)
Neutrophils: 69 %
Platelets: 238 10*3/uL (ref 150–450)
RBC: 4.64 x10E6/uL (ref 3.77–5.28)
RDW: 12 % (ref 11.7–15.4)
WBC: 5.9 10*3/uL (ref 3.4–10.8)

## 2020-07-07 LAB — COMPREHENSIVE METABOLIC PANEL
ALT: 11 IU/L (ref 0–32)
AST: 14 IU/L (ref 0–40)
Albumin/Globulin Ratio: 1.9 (ref 1.2–2.2)
Albumin: 4.5 g/dL (ref 3.8–4.8)
Alkaline Phosphatase: 83 IU/L (ref 48–121)
BUN/Creatinine Ratio: 18 (ref 9–23)
BUN: 14 mg/dL (ref 6–24)
Bilirubin Total: 0.6 mg/dL (ref 0.0–1.2)
CO2: 19 mmol/L — ABNORMAL LOW (ref 20–29)
Calcium: 8.8 mg/dL (ref 8.7–10.2)
Chloride: 105 mmol/L (ref 96–106)
Creatinine, Ser: 0.77 mg/dL (ref 0.57–1.00)
GFR calc Af Amer: 109 mL/min/{1.73_m2} (ref >59)
GFR calc non Af Amer: 95 mL/min/{1.73_m2} (ref >59)
Globulin, Total: 2.4 g/dL (ref 1.5–4.5)
Glucose: 90 mg/dL (ref 65–99)
Potassium: 4.6 mmol/L (ref 3.5–5.2)
Sodium: 138 mmol/L (ref 134–144)
Total Protein: 6.9 g/dL (ref 6.0–8.5)

## 2020-07-07 LAB — LIPID PANEL
Chol/HDL Ratio: 2.1 ratio (ref 0.0–4.4)
Cholesterol, Total: 116 mg/dL (ref 100–199)
HDL: 54 mg/dL (ref >39)
LDL Chol Calc (NIH): 52 mg/dL (ref 0–99)
Triglycerides: 40 mg/dL (ref 0–149)
VLDL Cholesterol Cal: 10 mg/dL (ref 5–40)

## 2020-07-07 LAB — HEPATITIS C ANTIBODY: Hep C Virus Ab: <0.1 s/co ratio (ref 0.0–0.9)

## 2020-07-07 LAB — HIV ANTIBODY (ROUTINE TESTING W REFLEX): HIV Screen 4th Generation wRfx: NONREACTIVE

## 2020-07-07 NOTE — Telephone Encounter (Signed)
LMTCB, [facility] Triage Nurse may give patient results  

## 2020-07-07 NOTE — Telephone Encounter (Signed)
-----   Message from Erasmo Downer, MD sent at 07/07/2020  8:11 AM EDT ----- Normal labs

## 2020-07-07 NOTE — Telephone Encounter (Signed)
Reviewed lab results and physician's note with the patient. No further questions. 

## 2020-08-10 ENCOUNTER — Telehealth: Payer: Self-pay

## 2020-08-10 NOTE — Telephone Encounter (Signed)
Copied from CRM [id number]. Topic: General - Other >> Aug 10, 2020  9:32 AM Dalphine Handing A wrote: Patient would like to schedule covid booster.

## 2020-08-12 NOTE — Telephone Encounter (Signed)
I am guessing it would be ok.  She is Obese and has high cholesterol. Those are comorbidities that should qualify her.

## 2020-08-20 ENCOUNTER — Ambulatory Visit: Payer: BC Managed Care – PPO

## 2020-10-04 ENCOUNTER — Other Ambulatory Visit: Payer: Self-pay | Admitting: Family Medicine

## 2020-10-07 ENCOUNTER — Other Ambulatory Visit: Payer: Self-pay | Admitting: Family Medicine

## 2020-10-07 MED ORDER — PROPRANOLOL HCL 10 MG PO TABS: 10.0000 mg | ORAL_TABLET | Freq: Every day | ORAL | 3 refills | Status: DC

## 2020-10-07 NOTE — Addendum Note (Signed)
Addended by: Lisabeth Pick on: 10/07/2020 01:25 PM   Modules accepted: Orders

## 2020-10-07 NOTE — Telephone Encounter (Signed)
PT need a refill  propranolol (INDERAL) 10 MG tablet [[id number]]  [id number] Judithann Sheen, [place] - [address]  [address] [place] [address]  Phone: [phone] Fax: [phone]

## 2020-10-25 ENCOUNTER — Other Ambulatory Visit: Payer: Self-pay | Admitting: Family Medicine

## 2020-10-26 NOTE — Telephone Encounter (Signed)
Requested medication (s) are due for refill today:  NO  Sent via Interface  Requested medication (s) are on the active medication list: yes  Last refill: 07/05/20  #15  5 refills  Future visit scheduled  Yes  01/03/21  Notes to clinic:  Not delegated  Requested Prescriptions  Pending Prescriptions Disp Refills   zolpidem (AMBIEN) 5 MG tablet [Pharmacy Med Name: ZOLPIDEM TARTRATE 5 MG TABLET] 15 tablet     Sig: Take 0.5 tablets (2.5 mg total) by mouth at bedtime as needed.      Not Delegated - Psychiatry:  Anxiolytics/Hypnotics Failed - 10/25/2020 10:44 PM      Failed - This refill cannot be delegated      Failed - Urine Drug Screen completed in last 360 days      Passed - Valid encounter within last 6 months    Recent Outpatient Visits           3 months ago Annual physical exam   [facility] [facility], Marzella Schlein, MD   6 months ago Chronic migraine without aura without status migrainosus, not intractable   [facility], Marzella Schlein, MD   1 year ago Solitary pulmonary nodule   [facility] [place], Marzella Schlein, MD   1 year ago Erroneous encounter - disregard   [facility], Marzella Schlein, MD       Future Appointments             In 2 months Bacigalupo, Marzella Schlein, MD [facility]   In 8 months Bacigalupo, Marzella Schlein, MD [facility]

## 2020-11-03 ENCOUNTER — Other Ambulatory Visit: Payer: Self-pay | Admitting: Family Medicine

## 2020-11-03 DIAGNOSIS — Z1231 Encounter for screening mammogram for malignant neoplasm of breast: Secondary | ICD-10-CM

## 2020-11-08 ENCOUNTER — Ambulatory Visit
Admission: RE | Admit: 2020-11-08 | Discharge: 2020-11-08 | Disposition: A | Discharge status: Home / Self Care | Payer: BC Managed Care – PPO | Source: Ambulatory Visit | Attending: Family Medicine | Admitting: Family Medicine

## 2020-11-08 ENCOUNTER — Other Ambulatory Visit: Payer: Self-pay

## 2020-11-08 DIAGNOSIS — Z1231 Encounter for screening mammogram for malignant neoplasm of breast: Secondary | ICD-10-CM | POA: Insufficient documentation

## 2021-01-03 ENCOUNTER — Telehealth (INDEPENDENT_AMBULATORY_CARE_PROVIDER_SITE_OTHER): Payer: BC Managed Care – PPO | Admitting: Family Medicine

## 2021-01-03 ENCOUNTER — Encounter: Payer: Self-pay | Admitting: Family Medicine

## 2021-01-03 VITALS — Ht 67.0 in | Wt 196.0 lb

## 2021-01-03 DIAGNOSIS — F411 Generalized anxiety disorder: Secondary | ICD-10-CM

## 2021-01-03 MED ORDER — TRIAMCINOLONE ACETONIDE 0.5 % EX OINT: 1.0000 "application " | TOPICAL_OINTMENT | Freq: Two times a day (BID) | CUTANEOUS | 5 refills | Status: DC

## 2021-01-03 MED ORDER — CITALOPRAM HYDROBROMIDE 20 MG PO TABS: 20.0000 mg | ORAL_TABLET | Freq: Every day | ORAL | 1 refills | Status: DC

## 2021-01-03 NOTE — Progress Notes (Signed)
MyChart Video Visit    Virtual Visit via Video Note   This visit type was conducted due to national recommendations for restrictions regarding the COVID-19 Pandemic (e.g. social distancing) in an effort to limit this patient's exposure and mitigate transmission in our community. This patient is at least at moderate risk for complications without adequate follow up. This format is felt to be most appropriate for this patient at this time. Physical exam was limited by quality of the video and audio technology used for the visit.    Patient location: home Provider location: [facility] Persons involved in the visit: patient, provider   I discussed the limitations of evaluation and management by telemedicine and the availability of in person appointments. The patient expressed understanding and agreed to proceed.  Patient: Melissa Ho   DOB: 04-Dec-1975   45 y.o. Female  MRN: [id number] Visit Date: 01/03/2021  Today's healthcare provider: Shirlee Latch, MD   Chief Complaint  Patient presents with  . Anxiety   Subjective    HPI  Anxiety, Follow-up  She was last seen for anxiety 6 months ago. Changes made at last visit include no changes.   She reports excellent compliance with treatment. She reports excellent tolerance of treatment. She is not having side effects.   She feels her anxiety is mild and Unchanged since last visit.  Symptoms: No chest pain No difficulty concentrating  No dizziness No fatigue  No feelings of losing control No insomnia  No irritable No palpitations  No panic attacks No racing thoughts  No shortness of breath No sweating  No tremors/shakes    GAD-7 Results GAD-7 Generalized Anxiety Disorder Screening Tool 01/03/2021 04/12/2020 10/06/2019  1. Feeling Nervous, Anxious, or on Edge 0 1 0  2. Not Being Able to Stop or Control Worrying 0 1 0  3. Worrying Too Much About Different Things 0 1 1  4. Trouble Relaxing 0 0 0  5. Being  So Restless it's Hard To Sit Still 0 0 0  6. Becoming Easily Annoyed or Irritable 0 1 0  7. Feeling Afraid As If Something Awful Might Happen 0 0 0  Total GAD-7 Score 0 4 1  Difficulty At Work, Home, or Getting  Along With Others? Not difficult at all Somewhat difficult Not difficult at all    PHQ-9 Scores PHQ9 SCORE ONLY 01/03/2021 07/05/2020 04/12/2020  PHQ-9 Total Score 2 1 3     ---------------------------------------------------------------------------------------------------   Patient Active Problem List   Diagnosis Date Noted  . Recurrent UTI 07/06/2020  . Annual physical exam 07/05/2020  . Right wrist pain 04/12/2020  . Solitary pulmonary nodule 10/06/2019  . Insomnia 10/06/2019  . GAD (generalized anxiety disorder) 10/06/2019  . Hyperlipidemia 10/06/2019  . Family history of breast cancer 10/06/2019  . Seasonal allergic rhinitis due to pollen 10/06/2019  . Chronic migraine without aura without status migrainosus, not intractable 10/06/2019  . Palpitations 10/06/2019  . Obesity 10/06/2019   Social History   Tobacco Use  . Smoking status: Former Smoker    Packs/day: 0.50    Years: 16.00    Pack years: 8.00    Types: Cigarettes    Quit date: 09/06/2010    Years since quitting: 10.3  . Smokeless tobacco: Never Used  Vaping Use  . Vaping Use: Never used  Substance Use Topics  . Alcohol use: Yes    Alcohol/week: 4.0 standard drinks    Types: 4 Glasses of wine per week  . Drug use: Never  No Known Allergies    Medications: Outpatient Medications Prior to Visit  Medication Sig  . atorvastatin (LIPITOR) 40 MG tablet Take 1 tablet (40 mg total) by mouth at bedtime.  . fluticasone (FLONASE) 50 MCG/ACT nasal spray Place 1 spray into both nostrils as needed for allergies or rhinitis.  Marland Kitchen propranolol (INDERAL) 10 MG tablet Take 1 tablet (10 mg total) by mouth daily.  Marland Kitchen trimethoprim (TRIMPEX) 100 MG tablet Take 1 tablet (100 mg total) by mouth as needed (after  intercourse).  Marland Kitchen zolpidem (AMBIEN) 5 MG tablet TAKE 0.5 TABLETS (2.5 MG TOTAL) BY MOUTH AT BEDTIME AS NEEDED.  . [DISCONTINUED] citalopram (CELEXA) 20 MG tablet TAKE 1 TABLET BY MOUTH EVERY DAY   No facility-administered medications prior to visit.    Review of Systems - per HPI  Last thyroid functions No results found for: TSH, T3TOTAL, T4TOTAL, THYROIDAB Last vitamin D No results found for: 25OHVITD2, 25OHVITD3, VD25OH Last vitamin B12 and Folate No results found for: VITAMINB12, FOLATE    Objective    Ht 5\' 7"  (1.702 m)   Wt 196 lb (88.9 kg)   LMP 12/22/2020 (Approximate)   BMI 30.70 kg/m  BP Readings from Last 3 Encounters:  07/05/20 95/65  04/12/20 116/68  10/06/19 111/75   Wt Readings from Last 3 Encounters:  01/03/21 196 lb (88.9 kg)  07/05/20 197 lb (89.4 kg)  04/12/20 198 lb (89.8 kg)      Physical Exam Constitutional:      General: She is not in acute distress. Eyes:     Conjunctiva/sclera: Conjunctivae normal.  Pulmonary:     Effort: Pulmonary effort is normal. No respiratory distress.  Neurological:     Mental Status: She is alert. Mental status is at baseline.  Psychiatric:        Mood and Affect: Mood normal.        Behavior: Behavior normal.        Assessment & Plan     Problem List Items Addressed This Visit      Other   GAD (generalized anxiety disorder) - Primary    Longstanding and well controlled Continue celexa at current dose Have discussed benefits of therapy F/u in 54m and repeat GAD7      Relevant Medications   citalopram (CELEXA) 20 MG tablet       Return in about 6 months (around 07/03/2021) for CPE, as scheduled.     I discussed the assessment and treatment plan with the patient. The patient was provided an opportunity to ask questions and all were answered. The patient agreed with the plan and demonstrated an understanding of the instructions.   The patient was advised to call back or seek an in-person evaluation if  the symptoms worsen or if the condition fails to improve as anticipated.  I, 07/05/2021, MD, have reviewed all documentation for this visit. The documentation on 01/03/21 for the exam, diagnosis, procedures, and orders are all accurate and complete.   Vikrant Pryce, 01/05/21, MD, MPH [facility]

## 2021-01-03 NOTE — Assessment & Plan Note (Signed)
Longstanding and well controlled Continue celexa at current dose Have discussed benefits of therapy F/u in 83m and repeat GAD7

## 2021-01-23 ENCOUNTER — Other Ambulatory Visit: Payer: Self-pay | Admitting: Family Medicine

## 2021-01-24 NOTE — Telephone Encounter (Signed)
Requested medication (s) are due for refill today: yes  Requested medication (s) are on the active medication list: yes  Last refill:  12/27/20  Future visit scheduled: yes  Notes to clinic:  not delegated    Requested Prescriptions  Pending Prescriptions Disp Refills   zolpidem (AMBIEN) 5 MG tablet [Pharmacy Med Name: ZOLPIDEM TARTRATE 5 MG TABLET] 15 tablet 2    Sig: TAKE 0.5 TABLETS (2.5 MG TOTAL) BY MOUTH AT BEDTIME AS NEEDED.      Not Delegated - Psychiatry:  Anxiolytics/Hypnotics Failed - 01/23/2021 10:29 PM      Failed - This refill cannot be delegated      Failed - Urine Drug Screen completed in last 360 days      Passed - Valid encounter within last 6 months    Recent Outpatient Visits           3 weeks ago GAD (generalized anxiety disorder)   [facility], Marzella Schlein, MD   6 months ago Annual physical exam   [facility] [place], Marzella Schlein, MD   9 months ago Chronic migraine without aura without status migrainosus, not intractable   [facility], Marzella Schlein, MD   1 year ago Solitary pulmonary nodule   [facility] [place], Marzella Schlein, MD   1 year ago Erroneous encounter - disregard   [facility], Marzella Schlein, MD       Future Appointments             In 5 months Bacigalupo, Marzella Schlein, MD [facility]

## 2021-04-25 ENCOUNTER — Other Ambulatory Visit: Payer: Self-pay | Admitting: Family Medicine

## 2021-04-25 NOTE — Telephone Encounter (Signed)
Requested medications are due for refill today yes  Requested medications are on the active medication list yes  Last refill 5/20  Last visit 12/2020  Future visit scheduled 07/2021  Notes to clinic Not Delegated.

## 2021-05-12 ENCOUNTER — Encounter: Payer: Self-pay | Admitting: Family Medicine

## 2021-05-17 ENCOUNTER — Encounter: Payer: Self-pay | Admitting: Family Medicine

## 2021-05-20 MED ORDER — AZITHROMYCIN 250 MG PO TABS: ORAL_TABLET | ORAL | 0 refills | Status: AC

## 2021-06-06 ENCOUNTER — Encounter: Payer: Self-pay | Admitting: Family Medicine

## 2021-06-06 ENCOUNTER — Ambulatory Visit (INDEPENDENT_AMBULATORY_CARE_PROVIDER_SITE_OTHER): Payer: Self-pay | Admitting: Family Medicine

## 2021-06-06 ENCOUNTER — Other Ambulatory Visit: Payer: Self-pay

## 2021-06-06 DIAGNOSIS — Y9315 Activity, underwater diving and snorkeling: Secondary | ICD-10-CM

## 2021-06-06 DIAGNOSIS — Z0189 Encounter for other specified special examinations: Secondary | ICD-10-CM

## 2021-06-06 DIAGNOSIS — Z8616 Personal history of COVID-19: Secondary | ICD-10-CM

## 2021-06-06 NOTE — Progress Notes (Signed)
PCP: Erasmo Downer, MD  Subjective:   HPI: Patient is a 45 y.o. female here for covid clearance for diving.  Patient reports she contracted Covid on 7/6. Had mostly head cold, secondary sinus infection, low grade temp (no higher than 99), slight cough. No chest pain, shortness of breath, palpitations. Has recovered without residual symptoms now.  Past Medical History:  Diagnosis Date   Allergy    Anxiety    Irregular heartbeat    Migraine     Current Outpatient Medications on File Prior to Visit  Medication Sig Dispense Refill   zolpidem (AMBIEN) 5 MG tablet TAKE 1/2 TABLET (2.5 MG TOTAL) BY MOUTH AT BEDTIME AS NEEDED. 15 tablet 2   atorvastatin (LIPITOR) 40 MG tablet Take 1 tablet (40 mg total) by mouth at bedtime. 90 tablet 3   citalopram (CELEXA) 20 MG tablet Take 1 tablet (20 mg total) by mouth daily. 90 tablet 1   fluticasone (FLONASE) 50 MCG/ACT nasal spray Place 1 spray into both nostrils as needed for allergies or rhinitis.     propranolol (INDERAL) 10 MG tablet Take 1 tablet (10 mg total) by mouth daily. 90 tablet 3   triamcinolone ointment (KENALOG) 0.5 % Apply 1 application topically 2 (two) times daily. 30 g 5   trimethoprim (TRIMPEX) 100 MG tablet Take 1 tablet (100 mg total) by mouth as needed (after intercourse). 30 tablet 3   No current facility-administered medications on file prior to visit.    Past Surgical History:  Procedure Laterality Date   NO PAST SURGERIES      No Known Allergies  Social History   Socioeconomic History   Marital status: Married    Spouse name: Not on file   Number of children: 0   Years of education: Not on file   Highest education level: Not on file  Occupational History   Occupation: Research officer, political party  Tobacco Use   Smoking status: Former    Packs/day: 0.50    Years: 16.00    Pack years: 8.00    Types: Cigarettes    Quit date: 09/06/2010    Years since quitting: 10.7   Smokeless tobacco: Never  Vaping Use    Vaping Use: Never used  Substance and Sexual Activity   Alcohol use: Yes    Alcohol/week: 4.0 standard drinks    Types: 4 Glasses of wine per week   Drug use: Never   Sexual activity: Yes    Partners: Male    Birth control/protection: None    Comment: husband had vasectomy  Other Topics Concern   Not on file  Social History Narrative   Not on file   Social Determinants of Health   Financial Resource Strain: Not on file  Food Insecurity: Not on file  Transportation Needs: Not on file  Physical Activity: Not on file  Stress: Not on file  Social Connections: Not on file  Intimate Partner Violence: Not on file    Family History  Problem Relation Age of Onset   Breast cancer Mother 67       x's 2   Heart attack Father        thought to be related to agent orange exposure   Stroke Father    Polymyalgia rheumatica Father    Leukemia Father        LGL T-Cell   Lung cancer Other        paternal uncle x2, smokers   Breast cancer Cousin    Colon  cancer Neg Hx     BP (!) 124/92   Ht 5\' 7"  (1.702 m)   Wt 197 lb (89.4 kg)   BMI 30.85 kg/m   No flowsheet data found.  No flowsheet data found.  Review of Systems: See HPI above.     Objective:  Physical Exam:  Gen: NAD, comfortable in exam room  CV: RRR no MRG Lungs: CTAB without wheezes, rales, rhonchi   Assessment & Plan:  1. Covid - symptoms mild without cardiopulmonary involvement.  Exam reassuring.  Cleared for diving without restrictions.

## 2021-07-03 ENCOUNTER — Other Ambulatory Visit: Payer: Self-pay | Admitting: Family Medicine

## 2021-07-03 NOTE — Telephone Encounter (Signed)
Requested Prescriptions  Pending Prescriptions Disp Refills  . citalopram (CELEXA) 20 MG tablet [Pharmacy Med Name: CITALOPRAM HBR 20 MG TABLET] 90 tablet 0    Sig: TAKE 1 TABLET BY MOUTH EVERY DAY     Psychiatry:  Antidepressants - SSRI Failed - 07/03/2021  6:33 AM      Failed - Valid encounter within last 6 months    Recent Outpatient Visits          6 months ago GAD (generalized anxiety disorder)   [facility], Marzella Schlein, MD   12 months ago Annual physical exam   [facility], Marzella Schlein, MD   1 year ago Chronic migraine without aura without status migrainosus, not intractable   [facility], Marzella Schlein, MD   1 year ago Solitary pulmonary nodule   [facility] [place], Marzella Schlein, MD   1 year ago Erroneous encounter - disregard   [facility], Marzella Schlein, MD      Future Appointments            In 1 week Bacigalupo, Marzella Schlein, MD [facility]

## 2021-07-12 ENCOUNTER — Other Ambulatory Visit: Payer: Self-pay

## 2021-07-12 ENCOUNTER — Ambulatory Visit (INDEPENDENT_AMBULATORY_CARE_PROVIDER_SITE_OTHER): Payer: BC Managed Care – PPO | Admitting: Family Medicine

## 2021-07-12 ENCOUNTER — Encounter: Payer: Self-pay | Admitting: Family Medicine

## 2021-07-12 ENCOUNTER — Other Ambulatory Visit (HOSPITAL_COMMUNITY)
Admission: RE | Admit: 2021-07-12 | Discharge: 2021-07-12 | Disposition: A | Discharge status: Home / Self Care | Payer: BC Managed Care – PPO | Source: Ambulatory Visit | Attending: Family Medicine | Admitting: Family Medicine

## 2021-07-12 VITALS — BP 110/75 | HR 66 | Temp 98.4°F | Ht 67.0 in | Wt 208.9 lb

## 2021-07-12 DIAGNOSIS — F411 Generalized anxiety disorder: Secondary | ICD-10-CM

## 2021-07-12 DIAGNOSIS — G4700 Insomnia, unspecified: Secondary | ICD-10-CM

## 2021-07-12 DIAGNOSIS — E785 Hyperlipidemia, unspecified: Secondary | ICD-10-CM | POA: Diagnosis not present

## 2021-07-12 DIAGNOSIS — Z124 Encounter for screening for malignant neoplasm of cervix: Secondary | ICD-10-CM | POA: Insufficient documentation

## 2021-07-12 DIAGNOSIS — E669 Obesity, unspecified: Secondary | ICD-10-CM

## 2021-07-12 DIAGNOSIS — Z0000 Encounter for general adult medical examination without abnormal findings: Secondary | ICD-10-CM

## 2021-07-12 DIAGNOSIS — M2170 Unequal limb length (acquired), unspecified site: Secondary | ICD-10-CM

## 2021-07-12 DIAGNOSIS — Z23 Encounter for immunization: Secondary | ICD-10-CM

## 2021-07-12 DIAGNOSIS — Z6832 Body mass index (BMI) 32.0-32.9, adult: Secondary | ICD-10-CM

## 2021-07-12 NOTE — Assessment & Plan Note (Signed)
Previously well controlled Continue statin Repeat FLP and CMP  

## 2021-07-12 NOTE — Assessment & Plan Note (Signed)
Chronic and stable Continue ambien prn

## 2021-07-12 NOTE — Assessment & Plan Note (Signed)
Discussed importance of healthy weight management Discussed diet and exercise  

## 2021-07-12 NOTE — Assessment & Plan Note (Signed)
Likely cause of intermittent back/SI joint soreness Consider SM referral in the future for possible shoe lift/custom orthotic

## 2021-07-12 NOTE — Assessment & Plan Note (Signed)
Chronic and well controlled Continue celexa at current dose 

## 2021-07-12 NOTE — Progress Notes (Signed)
Complete physical exam   Patient: Melissa Ho   DOB: [birth year]   45 y.o. Female  MRN: [id number] Visit Date: 07/12/2021  Today's healthcare provider: Shirlee Latch, MD   Chief Complaint  Patient presents with   Annual Exam   Subjective    Melissa Ho is a 45 y.o. female who presents today for a complete physical exam.  She reports consuming a general diet. The patient has a physically strenuous job, but has no regular exercise apart from work.  She generally feels well. She reports sleeping well. She doehave additional problems to discuss today.   HPI   Chronic Back Pain Patient states lower back pain on right side seems to be getting progressively worse and would like to discuss. 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 She sleeps with a pillow between her legs for relief. She declines shoe lifts at this time.   Medications She is tolerating and reports good compliance with all medicines. She denies any side affects.   Screenings  Mammogram 11/08/20 Pap-07/12/21  Vaccines  Tetanus-March 2020 Flu-09/06 COVID- she has received 3 vaccines    Past Medical History:  Diagnosis Date   Allergy    Anxiety    Irregular heartbeat    Migraine    Solitary pulmonary nodule 10/06/2019   Past Surgical History:  Procedure Laterality Date   NO PAST SURGERIES     Social History   Socioeconomic History   Marital status: Married    Spouse name: Not on file   Number of children: 0   Years of education: Not on file   Highest education level: Not on file  Occupational History   Occupation: Research officer, political party  Tobacco Use   Smoking status: Former    Packs/day: 0.50    Years: 16.00    Pack years: 8.00    Types: Cigarettes    Quit date: 09/06/2010    Years since quitting: 10.8   Smokeless tobacco: Never  Vaping Use   Vaping Use: Never used  Substance and Sexual Activity    Alcohol use: Yes    Alcohol/week: 4.0 standard drinks    Types: 4 Glasses of wine per week   Drug use: Never   Sexual activity: Yes    Partners: Male    Birth control/protection: None    Comment: husband had vasectomy  Other Topics Concern   Not on file  Social History Narrative   Not on file   Social Determinants of Health   Financial Resource Strain: Not on file  Food Insecurity: Not on file  Transportation Needs: Not on file  Physical Activity: Not on file  Stress: Not on file  Social Connections: Not on file  Intimate Partner Violence: Not on file   Family Status  Relation Name Status   Mother  Alive   Father  Alive   Brother  Alive   Other  (Not Specified)   Cousin pat Alive   Neg Hx  (Not Specified)   Family History  Problem Relation Age of Onset   Breast cancer Mother 29       x's 2   Heart attack Father        thought to be related to agent orange exposure   Stroke Father    Polymyalgia rheumatica Father    Leukemia Father        LGL T-Cell   Lung cancer  Other        paternal uncle x2, smokers   Breast cancer Cousin    Colon cancer Neg Hx    No Known Allergies  Patient Care Team: Erasmo DownerBacigalupo, Christon Gallaway M, MD as PCP - General (Family Medicine)   Medications: Outpatient Medications Prior to Visit  Medication Sig Note   atorvastatin (LIPITOR) 40 MG tablet Take 1 tablet (40 mg total) by mouth at bedtime.    citalopram (CELEXA) 20 MG tablet TAKE 1 TABLET BY MOUTH EVERY DAY    fluticasone (FLONASE) 50 MCG/ACT nasal spray Place 1 spray into both nostrils as needed for allergies or rhinitis.    propranolol (INDERAL) 10 MG tablet Take 1 tablet (10 mg total) by mouth daily.    trimethoprim (TRIMPEX) 100 MG tablet Take 1 tablet (100 mg total) by mouth as needed (after intercourse).    zolpidem (AMBIEN) 5 MG tablet TAKE 1/2 TABLET (2.5 MG TOTAL) BY MOUTH AT BEDTIME AS NEEDED.    triamcinolone ointment (KENALOG) 0.5 % Apply 1 application topically 2 (two) times  daily. (Patient not taking: Reported on 07/12/2021) 07/12/2021: Only uses for eczema when she has a flare up   No facility-administered medications prior to visit.    Review of Systems  Constitutional:  Negative for chills, diaphoresis, fatigue and fever.  HENT:  Negative for ear pain, sinus pressure, sinus pain and sore throat.   Eyes:  Negative for pain and visual disturbance.  Respiratory:  Negative for cough, chest tightness, shortness of breath and wheezing.   Cardiovascular:  Negative for chest pain, palpitations and leg swelling.  Gastrointestinal:  Negative for abdominal pain, blood in stool, constipation, diarrhea, nausea and vomiting.  Genitourinary:  Negative for dysuria, flank pain, frequency, pelvic pain and urgency.  Musculoskeletal:  Positive for back pain. Negative for myalgias and neck pain.  Allergic/Immunologic: Positive for environmental allergies.  Neurological:  Negative for dizziness, seizures, syncope, weakness, light-headedness, numbness and headaches.     Objective    BP 110/75 (BP Location: Right Arm, Patient Position: Sitting, Cuff Size: Normal)   Pulse 66   Temp 98.4 F (36.9 C) (Oral)   Ht 5\' 7"  (1.702 m)   Wt 208 lb 14.4 oz (94.8 kg)   LMP 07/06/2021 (Exact Date) Comment: last anywhere for 5-7 days  SpO2 96%   BMI 32.72 kg/m    Physical Exam Vitals reviewed.  Constitutional:      General: She is not in acute distress.    Appearance: Normal appearance. She is well-developed. She is not diaphoretic.  HENT:     Head: Normocephalic and atraumatic.     Right Ear: Tympanic membrane, ear canal and external ear normal.     Left Ear: Tympanic membrane, ear canal and external ear normal.     Nose: Nose normal.     Mouth/Throat:     Mouth: Mucous membranes are moist.     Pharynx: Oropharynx is clear. No oropharyngeal exudate.  Eyes:     General: No scleral icterus.    Conjunctiva/sclera: Conjunctivae normal.     Pupils: Pupils are equal, round, and  reactive to light.  Neck:     Thyroid: No thyromegaly.  Cardiovascular:     Rate and Rhythm: Normal rate and regular rhythm.     Pulses: Normal pulses.     Heart sounds: Normal heart sounds. No murmur heard. Pulmonary:     Effort: Pulmonary effort is normal. No respiratory distress.     Breath sounds: Normal breath sounds. No  wheezing or rales.  Abdominal:     General: There is no distension.     Palpations: Abdomen is soft.     Tenderness: There is no abdominal tenderness.  Musculoskeletal:        General: No deformity.     Cervical back: Neck supple. No tenderness.     Thoracic back: No tenderness.     Lumbar back: No tenderness.     Right lower leg: No edema.     Left lower leg: No edema.     Comments: Right hip higher than left   Lymphadenopathy:     Cervical: No cervical adenopathy.  Skin:    General: Skin is warm and dry.     Findings: No rash.  Neurological:     Mental Status: She is alert and oriented to person, place, and time. Mental status is at baseline.     Sensory: No sensory deficit.     Motor: No weakness.     Gait: Gait normal.  Psychiatric:        Mood and Affect: Mood normal.        Behavior: Behavior normal.        Thought Content: Thought content normal.      Last depression screening scores PHQ 2/9 Scores 07/12/2021 01/03/2021 07/05/2020  PHQ - 2 Score 0 0 0  PHQ- 9 Score 0 2 1   Last fall risk screening Fall Risk  07/12/2021  Falls in the past year? 0  Number falls in past yr: 0  Injury with Fall? 0  Risk for fall due to : No Fall Risks  Follow up -   Last Audit-C alcohol use screening Alcohol Use Disorder Test (AUDIT) 07/12/2021  1. How often do you have a drink containing alcohol? 3  2. How many drinks containing alcohol do you have on a typical day when you are drinking? 0  3. How often do you have six or more drinks on one occasion? 0  AUDIT-C Score 3  Alcohol Brief Interventions/Follow-up -   A score of 3 or more in women, and 4 or more  in men indicates increased risk for alcohol abuse, EXCEPT if all of the points are from question 1   No results found for any visits on 07/12/21.  Assessment & Plan     Problem List Items Addressed This Visit       Other   Insomnia    Chronic and stable Continue ambien prn      GAD (generalized anxiety disorder)    Chronic and well controlled Continue celexa at current dose      Hyperlipidemia    Previously well controlled Continue statin Repeat FLP and CMP      Relevant Orders   Comprehensive metabolic panel   Lipid Panel With LDL/HDL Ratio   Obesity    Discussed importance of healthy weight management Discussed diet and exercise       Relevant Orders   Comprehensive metabolic panel   Lipid Panel With LDL/HDL Ratio   Annual physical exam - Primary   Relevant Orders   Comprehensive metabolic panel   Lipid Panel With LDL/HDL Ratio   Leg length discrepancy    Likely cause of intermittent back/SI joint soreness Consider SM referral in the future for possible shoe lift/custom orthotic      Other Visit Diagnoses     Cervical cancer screening       Relevant Orders   Cytology - PAP   Need for  influenza vaccination       Relevant Orders   Flu Vaccine QUAD 42mo+IM (Fluarix, Fluzone & Alfiuria Quad PF) (Completed)       Routine Health Maintenance and Physical Exam  Exercise Activities and Dietary recommendations  Goals   None     Immunization History  Administered Date(s) Administered   Influenza,inj,Quad PF,6+ Mos 10/06/2019, 07/05/2020, 07/12/2021   PFIZER(Purple Top)SARS-COV-2 Vaccination 01/13/2020, 02/03/2020   PPD Test 07/09/2019, 06/23/2020    Health Maintenance  Topic Date Due   TETANUS/TDAP  Never done   PAP SMEAR-Modifier  01/13/2002   COVID-19 Vaccine (3 - Booster for [employer] series) 07/05/2020   INFLUENZA VACCINE  Completed   Hepatitis C Screening  Completed   HIV Screening  Completed   Pneumococcal Vaccine 74-30 Years old  Aged Out    HPV VACCINES  Aged Out    Discussed health benefits of physical activity, and encouraged her to engage in regular exercise appropriate for her age and condition.    Return in about 6 months (around 01/09/2022) for chronic disease f/u.     I,Essence Turner,acting as a Neurosurgeon for Shirlee Latch, MD.,have documented all relevant documentation on the behalf of Shirlee Latch, MD,as directed by  Shirlee Latch, MD while in the presence of Shirlee Latch, MD.  I, Shirlee Latch, MD, have reviewed all documentation for this visit. The documentation on 07/12/21 for the exam, diagnosis, procedures, and orders are all accurate and complete.   Kimia Finan, Marzella Schlein, MD, MPH [facility]

## 2021-07-13 LAB — LIPID PANEL WITH LDL/HDL RATIO
Cholesterol, Total: 119 mg/dL (ref 100–199)
HDL: 49 mg/dL (ref >39)
LDL Chol Calc (NIH): 56 mg/dL (ref 0–99)
LDL/HDL Ratio: 1.1 ratio (ref 0.0–3.2)
Triglycerides: 69 mg/dL (ref 0–149)
VLDL Cholesterol Cal: 14 mg/dL (ref 5–40)

## 2021-07-13 LAB — COMPREHENSIVE METABOLIC PANEL
ALT: 12 IU/L (ref 0–32)
AST: 16 IU/L (ref 0–40)
Albumin/Globulin Ratio: 1.9 (ref 1.2–2.2)
Albumin: 4.5 g/dL (ref 3.8–4.8)
Alkaline Phosphatase: 86 IU/L (ref 44–121)
BUN/Creatinine Ratio: 13 (ref 9–23)
BUN: 11 mg/dL (ref 6–24)
Bilirubin Total: 0.5 mg/dL (ref 0.0–1.2)
CO2: 25 mmol/L (ref 20–29)
Calcium: 9.3 mg/dL (ref 8.7–10.2)
Chloride: 107 mmol/L — ABNORMAL HIGH (ref 96–106)
Creatinine, Ser: 0.88 mg/dL (ref 0.57–1.00)
Globulin, Total: 2.4 g/dL (ref 1.5–4.5)
Glucose: 90 mg/dL (ref 65–99)
Potassium: 4.8 mmol/L (ref 3.5–5.2)
Sodium: 144 mmol/L (ref 134–144)
Total Protein: 6.9 g/dL (ref 6.0–8.5)
eGFR: 83 mL/min/{1.73_m2} (ref >59)

## 2021-07-20 ENCOUNTER — Other Ambulatory Visit: Payer: Self-pay | Admitting: Family Medicine

## 2021-07-21 LAB — CYTOLOGY - PAP
Comment: NEGATIVE
Diagnosis: NEGATIVE
Diagnosis: REACTIVE
High risk HPV: NEGATIVE

## 2021-07-28 ENCOUNTER — Other Ambulatory Visit: Payer: Self-pay | Admitting: Family Medicine

## 2021-07-28 NOTE — Telephone Encounter (Signed)
Requested medications are due for refill today yes  Requested medications are on the active medication list yes  Last refill 8/23  Last visit 07/2021  Future visit scheduled 01/2022  Notes to clinic Not Delegated

## 2021-08-02 NOTE — Telephone Encounter (Signed)
zolpidem (AMBIEN) 5 MG tablet 15 tablet 2 04/26/2021    Sig: TAKE 1/2 TABLET (2.5 MG TOTAL) BY MOUTH AT BEDTIME AS NEEDED.   Sent to pharmacy as: zolpidem (AMBIEN) 5 MG tablet   Notes to Pharmacy: Not to exceed 3 additional fills before 07/23/2021   E-Prescribing Status: Receipt confirmed by pharmacy (04/26/2021  1:09 PM EDT)   Pt has tried to get this refilled, last appt 9/6 and pharmacy needs new script. Pharmacy refused   [id number], [place] - [address] [place] [address]  Phone: [phone] Fax: [phone]  Hours: Not open 24 hours

## 2021-09-30 ENCOUNTER — Other Ambulatory Visit: Payer: Self-pay | Admitting: Family Medicine

## 2021-09-30 NOTE — Telephone Encounter (Signed)
Requested Prescriptions  Pending Prescriptions Disp Refills  . citalopram (CELEXA) 20 MG tablet [Pharmacy Med Name: CITALOPRAM HBR 20 MG TABLET] 90 tablet 0    Sig: TAKE 1 TABLET BY MOUTH EVERY DAY     Psychiatry:  Antidepressants - SSRI Passed - 09/30/2021  1:32 AM      Passed - Valid encounter within last 6 months    Recent Outpatient Visits          2 months ago Annual physical exam   [facility] [place], Marzella Schlein, MD   9 months ago GAD (generalized anxiety disorder)   [facility] [place], Marzella Schlein, MD   1 year ago Annual physical exam   [facility] [place], Marzella Schlein, MD   1 year ago Chronic migraine without aura without status migrainosus, not intractable   [facility], Marzella Schlein, MD   1 year ago Solitary pulmonary nodule   [facility], Marzella Schlein, MD      Future Appointments            In 3 months Bacigalupo, Marzella Schlein, MD [facility]

## 2021-10-31 ENCOUNTER — Other Ambulatory Visit: Payer: Self-pay | Admitting: Family Medicine

## 2021-11-15 ENCOUNTER — Ambulatory Visit: Payer: BC Managed Care – PPO | Admitting: Family Medicine

## 2021-12-20 ENCOUNTER — Other Ambulatory Visit: Payer: Self-pay | Admitting: Family Medicine

## 2021-12-20 DIAGNOSIS — Z1231 Encounter for screening mammogram for malignant neoplasm of breast: Secondary | ICD-10-CM

## 2021-12-28 ENCOUNTER — Other Ambulatory Visit: Payer: Self-pay | Admitting: Family Medicine

## 2021-12-29 ENCOUNTER — Other Ambulatory Visit: Payer: Self-pay | Admitting: Family Medicine

## 2022-01-06 NOTE — Progress Notes (Signed)
?  ? ? ?I,Sulibeya S Dimas,acting as a scribe for Shirlee Latch, MD.,have documented all relevant documentation on the behalf of Shirlee Latch, MD,as directed by  Shirlee Latch, MD while in the presence of Shirlee Latch, MD. ? ?Established patient visit ? ? ?Patient: Melissa Ho   DOB: 1976/06/02   46 y.o. Female  MRN: [id number] ?Visit Date: 01/09/2022 ? ?Today's healthcare provider: Shirlee Latch, MD  ? ?Chief Complaint  ?Patient presents with  ? Anxiety  ? Hyperlipidemia  ? Insomnia  ? ?Subjective  ?  ?HPI  ?Follow up for insomnia ? ?The patient was last seen for this 6 months ago. ?Changes made at last visit include continue ambien PRN. ?Patient reports taking 1/2 tablet every night. ? ?She reports excellent compliance with treatment. ?She feels that condition is Unchanged. ?She is not having side effects.  ? ?----------------------------------------------------------------------------------------- ?Anxiety, Follow-up ? ?She was last seen for anxiety 6 months ago. ?Changes made at last visit include continue celexa at current dose. ?  ?She reports excellent compliance with treatment. ?She reports excellent tolerance of treatment. ?She is not having side effects.  ? ?She feels her anxiety is moderate and Unchanged since last visit. ? ?A lot more stress recently, but it should be ending soon after buying a house. ? ?Symptoms: ?No chest pain No difficulty concentrating  ?No dizziness No fatigue  ?No feelings of losing control Yes insomnia  ?No irritable No palpitations  ?No panic attacks No racing thoughts  ?No shortness of breath No sweating  ?No tremors/shakes   ? ?GAD-7 Results ?GAD-7 Generalized Anxiety Disorder Screening Tool 01/09/2022 01/03/2021 04/12/2020  ?1. Feeling Nervous, Anxious, or on Edge 1 0 1  ?2. Not Being Able to Stop or Control Worrying 1 0 1  ?3. Worrying Too Much About Different Things 0 0 1  ?4. Trouble Relaxing 1 0 0  ?5. Being So Restless it's Hard To Sit Still 1 0 0  ?6.  Becoming Easily Annoyed or Irritable 0 0 1  ?7. Feeling Afraid As If Something Awful Might Happen 0 0 0  ?Total GAD-7 Score 4 0 4  ?Difficulty At Work, Home, or Getting  Along With Others? Not difficult at all Not difficult at all Somewhat difficult  ? ? ?PHQ-9 Scores ?PHQ9 SCORE ONLY 01/09/2022 07/12/2021 01/03/2021  ?PHQ-9 Total Score 3 0 2  ? ? ?--------------------------------------------------------------------------------------------------- ?Lipid/Cholesterol, Follow-up ? ?Last lipid panel Other pertinent labs  ?Lab Results  ?Component Value Date  ? CHOL 119 07/12/2021  ? HDL 49 07/12/2021  ? LDLCALC 56 07/12/2021  ? TRIG 69 07/12/2021  ? CHOLHDL 2.1 07/06/2020  ? Lab Results  ?Component Value Date  ? ALT 12 07/12/2021  ? AST 16 07/12/2021  ? PLT 238 07/06/2020  ?  ? ?She was last seen for this 6 months ago.  ?Management since that visit includes no changes. ? ?She reports excellent compliance with treatment. ?She is not having side effects.  ? ?Symptoms: ?No chest pain No chest pressure/discomfort  ?No dyspnea No lower extremity edema  ?No numbness or tingling of extremity No orthopnea  ?No palpitations No paroxysmal nocturnal dyspnea  ?No speech difficulty No syncope  ? ?Current diet: in general, a "healthy" diet   ?Current exercise: none ? ?The ASCVD Risk score (Arnett DK, et al., 2019) failed to calculate for the following reasons: ?  The valid total cholesterol range is 130 to 320 mg/dL ? ?--------------------------------------------------------------------------------------------------- ? ? ?Medications: ?Outpatient Medications Prior to Visit  ?Medication Sig  ?  atorvastatin (LIPITOR) 40 MG tablet TAKE 1 TABLET BY MOUTH EVERYDAY AT BEDTIME  ? citalopram (CELEXA) 20 MG tablet TAKE 1 TABLET BY MOUTH EVERY DAY  ? fluticasone (FLONASE) 50 MCG/ACT nasal spray Place 1 spray into both nostrils as needed for allergies or rhinitis.  ? propranolol (INDERAL) 10 MG tablet TAKE 1 TABLET BY MOUTH EVERY DAY  ? triamcinolone  ointment (KENALOG) 0.5 % Apply 1 application topically 2 (two) times daily.  ? trimethoprim (TRIMPEX) 100 MG tablet TAKE 1 TABLET (100 MG TOTAL) BY MOUTH AS NEEDED (AFTER INTERCOURSE).  ? [DISCONTINUED] zolpidem (AMBIEN) 5 MG tablet TAKE 1/2 TABLET (2.5 MG TOTAL) BY MOUTH AT BEDTIME AS NEEDED.  ? ?No facility-administered medications prior to visit.  ? ? ?Review of Systems  ?Constitutional:  Positive for fatigue. Negative for appetite change.  ?Respiratory:  Negative for chest tightness and shortness of breath.   ?Cardiovascular:  Negative for chest pain and palpitations.  ?Psychiatric/Behavioral:  Positive for sleep disturbance. The patient is nervous/anxious.   ? ?  ?  Objective  ?  ?BP 108/66 (BP Location: Left Arm, Patient Position: Sitting, Cuff Size: Large)   Pulse (!) 58   Temp 97.9 ?F (36.6 ?C) (Temporal)   Resp 16   Wt 214 lb (97.1 kg)   BMI 33.52 kg/m?  ?BP Readings from Last 3 Encounters:  ?01/09/22 108/66  ?07/12/21 110/75  ?06/06/21 (!) 124/92  ? ?Wt Readings from Last 3 Encounters:  ?01/09/22 214 lb (97.1 kg)  ?07/12/21 208 lb 14.4 oz (94.8 kg)  ?06/06/21 197 lb (89.4 kg)  ? ?  ? ?Physical Exam ?Vitals reviewed.  ?Constitutional:   ?   General: She is not in acute distress. ?   Appearance: Normal appearance. She is well-developed. She is not diaphoretic.  ?HENT:  ?   Head: Normocephalic and atraumatic.  ?Eyes:  ?   General: No scleral icterus. ?   Conjunctiva/sclera: Conjunctivae normal.  ?Neck:  ?   Thyroid: No thyromegaly.  ?Cardiovascular:  ?   Rate and Rhythm: Normal rate and regular rhythm.  ?   Pulses: Normal pulses.  ?   Heart sounds: Normal heart sounds. No murmur heard. ?Pulmonary:  ?   Effort: Pulmonary effort is normal. No respiratory distress.  ?   Breath sounds: Normal breath sounds. No wheezing, rhonchi or rales.  ?Musculoskeletal:  ?   Cervical back: Neck supple.  ?   Right lower leg: No edema.  ?   Left lower leg: No edema.  ?Lymphadenopathy:  ?   Cervical: No cervical adenopathy.   ?Skin: ?   General: Skin is warm and dry.  ?   Findings: Rash (eczema) present.  ?Neurological:  ?   Mental Status: She is alert and oriented to person, place, and time. Mental status is at baseline.  ?Psychiatric:     ?   Mood and Affect: Mood normal.     ?   Behavior: Behavior normal.  ?  ? ? ?No results found for any visits on 01/09/22. ? Assessment & Plan  ?  ? ?Problem List Items Addressed This Visit   ? ?  ? Musculoskeletal and Integument  ? Eczema  ?  Only on L foot  ?Encourage regular moisturizing with emollient ?No signs of secondary infection ?  ?  ?  ? Genitourinary  ? Recurrent UTI  ?  H/o recurrent UTIs postcoital ?Well controlled ?Continue trimethoprim prn postcoitally ?  ?  ?  ? Other  ? Insomnia  ?  Chronic and stable ?Continue ambien prn ?  ?  ? GAD (generalized anxiety disorder)  ?  Chronic and well controlled ?Continue celexa 20 mg daily ?  ?  ? Hyperlipidemia - Primary  ?  Previously well controlled ?Continue statin ?Check FLP annually ?  ?  ? Obesity  ?  Discussed importance of healthy weight management ?Discussed diet and exercise  ?  ?  ?  ? ?Return in about 6 months (around 07/12/2022) for CPE.  ?   ? ?I, Shirlee Latch, MD, have reviewed all documentation for this visit. The documentation on 01/09/22 for the exam, diagnosis, procedures, and orders are all accurate and complete. ? ? ?Erasmo Downer, MD, MPH ?[facility] ?[facility]   ?

## 2022-01-09 ENCOUNTER — Encounter: Payer: Self-pay | Admitting: Family Medicine

## 2022-01-09 ENCOUNTER — Ambulatory Visit (INDEPENDENT_AMBULATORY_CARE_PROVIDER_SITE_OTHER): Payer: BC Managed Care – PPO | Admitting: Family Medicine

## 2022-01-09 ENCOUNTER — Other Ambulatory Visit: Payer: Self-pay

## 2022-01-09 VITALS — BP 108/66 | HR 58 | Temp 97.9°F | Resp 16 | Wt 214.0 lb

## 2022-01-09 DIAGNOSIS — L309 Dermatitis, unspecified: Secondary | ICD-10-CM

## 2022-01-09 DIAGNOSIS — E785 Hyperlipidemia, unspecified: Secondary | ICD-10-CM | POA: Diagnosis not present

## 2022-01-09 DIAGNOSIS — G4700 Insomnia, unspecified: Secondary | ICD-10-CM | POA: Diagnosis not present

## 2022-01-09 DIAGNOSIS — E669 Obesity, unspecified: Secondary | ICD-10-CM

## 2022-01-09 DIAGNOSIS — N390 Urinary tract infection, site not specified: Secondary | ICD-10-CM

## 2022-01-09 DIAGNOSIS — F411 Generalized anxiety disorder: Secondary | ICD-10-CM | POA: Diagnosis not present

## 2022-01-09 DIAGNOSIS — Z6832 Body mass index (BMI) 32.0-32.9, adult: Secondary | ICD-10-CM

## 2022-01-09 MED ORDER — ZOLPIDEM TARTRATE 5 MG PO TABS: 2.5000 mg | ORAL_TABLET | Freq: Every evening | ORAL | 5 refills | Status: DC | PRN

## 2022-01-09 NOTE — Assessment & Plan Note (Signed)
Only on L foot  ?Encourage regular moisturizing with emollient ?No signs of secondary infection ?

## 2022-01-09 NOTE — Assessment & Plan Note (Signed)
Previously well controlled ?Continue statin ?Check FLP annually ?

## 2022-01-09 NOTE — Assessment & Plan Note (Signed)
Chronic and stable Continue ambien prn 

## 2022-01-09 NOTE — Assessment & Plan Note (Signed)
Discussed importance of healthy weight management Discussed diet and exercise  

## 2022-01-09 NOTE — Assessment & Plan Note (Signed)
Chronic and well controlled ?Continue celexa 20 mg daily ?

## 2022-01-09 NOTE — Assessment & Plan Note (Signed)
H/o recurrent UTIs postcoital ?Well controlled ?Continue trimethoprim prn postcoitally ?

## 2022-01-30 ENCOUNTER — Ambulatory Visit
Admission: RE | Admit: 2022-01-30 | Discharge: 2022-01-30 | Disposition: A | Discharge status: Home / Self Care | Payer: BC Managed Care – PPO | Source: Ambulatory Visit | Attending: Family Medicine | Admitting: Family Medicine

## 2022-01-30 ENCOUNTER — Other Ambulatory Visit: Payer: Self-pay

## 2022-01-30 DIAGNOSIS — Z1231 Encounter for screening mammogram for malignant neoplasm of breast: Secondary | ICD-10-CM | POA: Diagnosis not present

## 2022-03-28 ENCOUNTER — Other Ambulatory Visit: Payer: Self-pay | Admitting: Family Medicine

## 2022-06-24 ENCOUNTER — Other Ambulatory Visit: Payer: Self-pay | Admitting: Family Medicine

## 2022-06-28 DIAGNOSIS — Z0189 Encounter for other specified special examinations: Secondary | ICD-10-CM | POA: Insufficient documentation

## 2022-06-28 DIAGNOSIS — Y9315 Activity, underwater diving and snorkeling: Secondary | ICD-10-CM | POA: Insufficient documentation

## 2022-06-29 ENCOUNTER — Ambulatory Visit
Admission: RE | Admit: 2022-06-29 | Discharge: 2022-06-29 | Disposition: A | Discharge status: Home / Self Care | Payer: No Typology Code available for payment source | Source: Ambulatory Visit | Attending: Sports Medicine | Admitting: Sports Medicine

## 2022-06-29 ENCOUNTER — Other Ambulatory Visit: Payer: BC Managed Care – PPO

## 2022-06-29 ENCOUNTER — Ambulatory Visit (INDEPENDENT_AMBULATORY_CARE_PROVIDER_SITE_OTHER): Payer: Self-pay | Admitting: Sports Medicine

## 2022-06-29 DIAGNOSIS — Y9315 Activity, underwater diving and snorkeling: Secondary | ICD-10-CM

## 2022-06-29 DIAGNOSIS — Z021 Encounter for pre-employment examination: Secondary | ICD-10-CM | POA: Diagnosis not present

## 2022-06-29 DIAGNOSIS — Z0189 Encounter for other specified special examinations: Secondary | ICD-10-CM

## 2022-06-29 DIAGNOSIS — R911 Solitary pulmonary nodule: Secondary | ICD-10-CM | POA: Diagnosis not present

## 2022-06-29 LAB — GLUCOSE, POCT (MANUAL RESULT ENTRY): POC Glucose: 116 mg/dl — AB (ref 70–99)

## 2022-06-29 LAB — POCT HEMOGLOBIN: Hemoglobin: 12.8 g/dL (ref 11–14.6)

## 2022-06-29 LAB — POCT UA - GLUCOSE/PROTEIN
Glucose, UA: NEGATIVE
Protein, UA: NEGATIVE

## 2022-06-29 NOTE — Progress Notes (Signed)
PCP: Erasmo Downer, MD  Subjective:   HPI: Patient is a 46 y.o. 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  CXR completed this morning- only abnormality is 1.7 centimeters smoothly marginated nodule in left lower lung field, however no change since prior x-ray in 2020.  She had CT scan in 2020 for follow-up on this. EKG shows normal sinus rhythm, no acute ST changes or QT prolongation. Hemoglobin within normal limits, 12.8.  No anemia. Glucose within normal limits.  No chest pain, shortness of breath, vertigo.  Overall, feeling well.  Past Medical History:  Diagnosis Date   Allergy    Anxiety    Irregular heartbeat    Migraine    Solitary pulmonary nodule 10/06/2019    Current Outpatient Medications on File Prior to Visit  Medication Sig Dispense Refill   atorvastatin (LIPITOR) 40 MG tablet TAKE 1 TABLET BY MOUTH EVERYDAY AT BEDTIME 90 tablet 3   citalopram (CELEXA) 20 MG tablet TAKE 1 TABLET BY MOUTH EVERY DAY 90 tablet 0   fluticasone (FLONASE) 50 MCG/ACT nasal spray Place 1 spray into both nostrils as needed for allergies or rhinitis.     propranolol (INDERAL) 10 MG tablet TAKE 1 TABLET BY MOUTH EVERY DAY 90 tablet 1   triamcinolone ointment (KENALOG) 0.5 % Apply 1 application topically 2 (two) times daily. 30 g 5   trimethoprim (TRIMPEX) 100 MG tablet TAKE 1 TABLET (100 MG TOTAL) BY MOUTH AS NEEDED (AFTER INTERCOURSE). 30 tablet 3   zolpidem (AMBIEN) 5 MG tablet Take 0.5 tablets (2.5 mg total) by mouth at bedtime as needed for sleep. 15 tablet 5   No current facility-administered medications on file prior to visit.    Past Surgical History:  Procedure Laterality Date   NO PAST SURGERIES      No Known Allergies  BP 114/69   Ht 5\' 7"  (1.702 m)   Wt 210 lb (95.3 kg)   BMI 32.89 kg/m       No data to display              No data to display              Objective:  Physical Exam:  Gen: NAD, comfortable in exam room HEENT:  Pupils PERRLA, conjunctiva normal.  EOMI. CV: Regular rate and rhythm, no murmurs Respiratory: Normal work of breathing on room air, no wheezing or crackles. MSK: No joint deformities.  Full range of motion of all extremities. Skin: Warm, dry, no rashes or lesions. Neuro: Alert and oriented x4, no focal neurologic deficits. Psych: Normal mood and affect.   Assessment & Plan:  1.  Yearly physical exam for diving Reassuring physical exam.  No abnormalities noted on EKG, labs.  Chest x-ray without active cardiopulmonary disease. Cleared for driving without restrictions. Paperwork filled out for job.  Patient seen and evaluated with the resident.  I agree with the above plan of care.  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  A copy of her paperwork is available for review in epic.  Follow-up as needed.

## 2022-07-12 ENCOUNTER — Other Ambulatory Visit: Payer: Self-pay | Admitting: Family Medicine

## 2022-07-15 ENCOUNTER — Other Ambulatory Visit: Payer: Self-pay | Admitting: Family Medicine

## 2022-07-17 ENCOUNTER — Encounter: Payer: BC Managed Care – PPO | Admitting: Family Medicine

## 2022-08-29 ENCOUNTER — Other Ambulatory Visit: Payer: Self-pay | Admitting: Family Medicine

## 2022-09-05 ENCOUNTER — Other Ambulatory Visit: Payer: Self-pay | Admitting: Family Medicine

## 2022-09-06 NOTE — Telephone Encounter (Signed)
Requested medication (s) are due for refill today: yes  Requested medication (s) are on the active medication list: yes  Last refill:  07/13/22 #15  Future visit scheduled: yes  Notes to clinic:  med not delegated to NT to reorder   Requested Prescriptions  Pending Prescriptions Disp Refills   zolpidem (AMBIEN) 5 MG tablet [Pharmacy Med Name: ZOLPIDEM TARTRATE 5 MG TABLET] 15 tablet     Sig: TAKE 1/2 TABLET (2.5 MG TOTAL) BY MOUTH AT BEDTIME AS NEEDED FOR SLEEP     Not Delegated - Psychiatry:  Anxiolytics/Hypnotics Failed - 09/05/2022 10:40 PM      Failed - This refill cannot be delegated      Failed - Urine Drug Screen completed in last 360 days      Failed - Valid encounter within last 6 months    Recent Outpatient Visits           8 months ago Hyperlipidemia, unspecified hyperlipidemia type   [facility], Dionne Bucy, MD   1 year ago Annual physical exam   [facility] [place], Dionne Bucy, MD   1 year ago GAD (generalized anxiety disorder)   [facility], Dionne Bucy, MD   2 years ago Annual physical exam   [facility], Dionne Bucy, MD   2 years ago Chronic migraine without aura without status migrainosus, not intractable   [facility] Bacigalupo, Dionne Bucy, MD       Future Appointments             In 5 days Bacigalupo, Dionne Bucy, MD [facility] [place]

## 2022-09-08 NOTE — Progress Notes (Unsigned)
I,Dalissa Lovin S Keamber Macfadden,acting as a Education administrator for Lavon Paganini, MD.,have documented all relevant documentation on the behalf of Lavon Paganini, MD,as directed by  Lavon Paganini, MD while in the presence of Lavon Paganini, MD.    Complete physical exam   Patient: Melissa Ho   DOB: January 21, 1976   46 y.o. Female  MRN: [id number] Visit Date: 09/11/2022  Today's healthcare provider: Lavon Paganini, MD   Chief Complaint  Patient presents with   Annual Exam   Subjective    Melissa Ho is a 46 y.o. female who presents today for a complete physical exam.  She reports consuming a general diet. The patient has a physically strenuous job, but has no regular exercise apart from work.  She generally feels well. She reports sleeping fairly well. She does not have additional problems to discuss today.  HPI  Patient reports she received a Tdap in 2020 in [place] due to a cat bite.   Past Medical History:  Diagnosis Date   Allergy    Anxiety    Irregular heartbeat    Migraine    Solitary pulmonary nodule 10/06/2019   Past Surgical History:  Procedure Laterality Date   NO PAST SURGERIES     Social History   Socioeconomic History   Marital status: Married    Spouse name: Not on file   Number of children: 0   Years of education: Not on file   Highest education level: Not on file  Occupational History   Occupation: Chief Strategy Officer  Tobacco Use   Smoking status: Former    Packs/day: 0.50    Years: 16.00    Total pack years: 8.00    Types: Cigarettes    Quit date: 09/06/2010    Years since quitting: 12.0   Smokeless tobacco: Never  Vaping Use   Vaping Use: Never used  Substance and Sexual Activity   Alcohol use: Yes    Alcohol/week: 4.0 standard drinks of alcohol    Types: 4 Glasses of wine per week   Drug use: Never   Sexual activity: Yes    Partners: Male    Birth control/protection: None    Comment: husband had vasectomy  Other Topics Concern   Not  on file  Social History Narrative   Not on file   Social Determinants of Health   Financial Resource Strain: Not on file  Food Insecurity: Not on file  Transportation Needs: Not on file  Physical Activity: Not on file  Stress: Not on file  Social Connections: Not on file  Intimate Partner Violence: Not on file   Family Status  Relation Name Status   Mother  Alive   Father  Alive   Brother  Alive   Other  (Not Specified)   Cousin pat Alive   Neg Hx  (Not Specified)   Family History  Problem Relation Age of Onset   Breast cancer Mother 62       x's 2   Heart attack Father        thought to be related to agent orange exposure   Stroke Father    Polymyalgia rheumatica Father    Leukemia Father        LGL T-Cell   Lung cancer Other        paternal uncle x2, smokers   Breast cancer Cousin    Colon cancer Neg Hx    No Known Allergies  Patient Care Team: Bacigalupo, Dionne Bucy, MD as PCP - General (Family Medicine)  Medications: Outpatient Medications Prior to Visit  Medication Sig   fluticasone (FLONASE) 50 MCG/ACT nasal spray Place 1 spray into both nostrils as needed for allergies or rhinitis.   propranolol (INDERAL) 10 MG tablet TAKE 1 TABLET BY MOUTH EVERY DAY   triamcinolone ointment (KENALOG) 0.5 % Apply 1 application topically 2 (two) times daily.   trimethoprim (TRIMPEX) 100 MG tablet TAKE 1 TABLET (100 MG TOTAL) BY MOUTH AS NEEDED (AFTER INTERCOURSE).   [DISCONTINUED] atorvastatin (LIPITOR) 40 MG tablet TAKE 1 TABLET BY MOUTH EVERYDAY AT BEDTIME   [DISCONTINUED] citalopram (CELEXA) 20 MG tablet TAKE 1 TABLET BY MOUTH EVERY DAY   [DISCONTINUED] zolpidem (AMBIEN) 5 MG tablet TAKE 1/2 TABLET (2.5 MG TOTAL) BY MOUTH AT BEDTIME AS NEEDED FOR SLEEP   No facility-administered medications prior to visit.    Review of Systems  Musculoskeletal:  Positive for back pain.  Allergic/Immunologic: Positive for environmental allergies.  All other systems reviewed and are  negative.   Last CBC Lab Results  Component Value Date   WBC 5.9 07/06/2020   HGB 12.8 06/29/2022   HCT 39.3 07/06/2020   MCV 85 07/06/2020   MCH 28.7 07/06/2020   RDW 12.0 07/06/2020   PLT 238 82/42/3536   Last metabolic panel Lab Results  Component Value Date   GLUCOSE 90 07/12/2021   NA 144 07/12/2021   K 4.8 07/12/2021   CL 107 (H) 07/12/2021   CO2 25 07/12/2021   BUN 11 07/12/2021   CREATININE 0.88 07/12/2021   EGFR 83 07/12/2021   CALCIUM 9.3 07/12/2021   PROT 6.9 07/12/2021   ALBUMIN 4.5 07/12/2021   LABGLOB 2.4 07/12/2021   AGRATIO 1.9 07/12/2021   BILITOT 0.5 07/12/2021   ALKPHOS 86 07/12/2021   AST 16 07/12/2021   ALT 12 07/12/2021   Last lipids Lab Results  Component Value Date   CHOL 119 07/12/2021   HDL 49 07/12/2021   LDLCALC 56 07/12/2021   TRIG 69 07/12/2021   CHOLHDL 2.1 07/06/2020      Objective    BP 107/75 (BP Location: Left Arm, Patient Position: Sitting, Cuff Size: Large)   Pulse 76   Temp 98.6 F (37 C) (Oral)   Resp 16   Ht _0  (1.702 m)   Wt 216 lb 12.8 oz (98.3 kg)   LMP 08/28/2022 (Approximate)   BMI 33.96 kg/m  BP Readings from Last 3 Encounters:  09/11/22 107/75  06/29/22 114/69  01/09/22 108/66   Wt Readings from Last 3 Encounters:  09/11/22 216 lb 12.8 oz (98.3 kg)  06/29/22 210 lb (95.3 kg)  01/09/22 214 lb (97.1 kg)      Physical Exam Vitals reviewed.  Constitutional:      General: She is not in acute distress.    Appearance: Normal appearance. She is well-developed. She is not diaphoretic.  HENT:     Head: Normocephalic and atraumatic.     Right Ear: Tympanic membrane, ear canal and external ear normal.     Left Ear: Tympanic membrane, ear canal and external ear normal.     Nose: Nose normal.     Mouth/Throat:     Mouth: Mucous membranes are moist.     Pharynx: Oropharynx is clear. No oropharyngeal exudate.  Eyes:     General: No scleral icterus.    Conjunctiva/sclera: Conjunctivae normal.      Pupils: Pupils are equal, round, and reactive to light.  Neck:     Thyroid: No thyromegaly.  Cardiovascular:  Rate and Rhythm: Normal rate and regular rhythm.     Pulses: Normal pulses.     Heart sounds: Normal heart sounds. No murmur heard. Pulmonary:     Effort: Pulmonary effort is normal. No respiratory distress.     Breath sounds: Normal breath sounds. No wheezing or rales.  Abdominal:     General: There is no distension.     Palpations: Abdomen is soft.     Tenderness: There is no abdominal tenderness.  Musculoskeletal:        General: No deformity.     Cervical back: Neck supple.     Right lower leg: No edema.     Left lower leg: No edema.     Comments: No TTP over heel/plantar fascia  Lymphadenopathy:     Cervical: No cervical adenopathy.  Skin:    General: Skin is warm and dry.     Findings: No rash.  Neurological:     Mental Status: She is alert and oriented to person, place, and time. Mental status is at baseline.     Sensory: No sensory deficit.     Motor: No weakness.     Gait: Gait normal.  Psychiatric:        Mood and Affect: Mood normal.        Behavior: Behavior normal.        Thought Content: Thought content normal.       Last depression screening scores    09/11/2022    9:42 AM 01/09/2022    8:38 AM 07/12/2021    8:41 AM  PHQ 2/9 Scores  PHQ - 2 Score 0 0 0  PHQ- 9 Score 2 3 0   Last fall risk screening    09/11/2022    9:42 AM  [place] in the past year? 0  Number falls in past yr: 0  Injury with Fall? 0  Risk for fall due to : No Fall Risks  Follow up Falls evaluation completed   Last Audit-C alcohol use screening    09/11/2022    9:43 AM  Alcohol Use Disorder Test (AUDIT)  1. How often do you have a drink containing alcohol? 4  2. How many drinks containing alcohol do you have on a typical day when you are drinking? 0  3. How often do you have six or more drinks on one occasion? 0  AUDIT-C Score 4   A score of 3 or more in  women, and 4 or more in men indicates increased risk for alcohol abuse, EXCEPT if all of the points are from question 1   No results found for any visits on 09/11/22.  Assessment & Plan    Routine Health Maintenance and Physical Exam  Exercise Activities and Dietary recommendations  Goals   None     Immunization History  Administered Date(s) Administered   COVID-19, mRNA, vaccine(Comirnaty)12 years and older 09/04/2022   Influenza,inj,Quad PF,6+ Mos 10/06/2019, 07/05/2020, 07/12/2021, 09/04/2022   PFIZER(Purple Top)SARS-COV-2 Vaccination 01/13/2020, 02/03/2020   PPD Test 07/09/2019, 06/23/2020   Pfizer Covid-19 Vaccine Bivalent Booster 10yr & up 09/25/2021    Health Maintenance  Topic Date Due   TETANUS/TDAP  Never done   COLONOSCOPY (Pts 45-471yrInsurance coverage will need to be confirmed)  Never done   PAP SMEAR-Modifier  07/12/2026   INFLUENZA VACCINE  Completed   COVID-19 Vaccine  Completed   Hepatitis C Screening  Completed   HIV Screening  Completed   HPV VACCINES  Aged Out    Discussed health benefits of physical activity, and encouraged her to engage in regular exercise appropriate for her age and condition.  Problem List Items Addressed This Visit       Musculoskeletal and Integument   Plantar fasciitis    New problem Mild Bilateral feet Encouraged rest, ice, stretching Discussed frozen water bottle track Home exercise program given Encouraged supportive shoes        Other   Insomnia    Chronic and stable Continue Ambien at current dose      GAD (generalized anxiety disorder)    Chronic and well-controlled Continue Celexa at current dose      Relevant Medications   citalopram (CELEXA) 20 MG tablet   Hyperlipidemia    Previously well controlled Continue statin Recheck FLP and CMP      Relevant Medications   atorvastatin (LIPITOR) 40 MG tablet   Other Relevant Orders   Lipid Panel With LDL/HDL Ratio   Obesity    Discussed importance  of healthy weight management Discussed diet and exercise       Other Visit Diagnoses     Annual physical exam    -  Primary   Relevant Orders   Comprehensive metabolic panel   Lipid Panel With LDL/HDL Ratio   Colon cancer screening       Relevant Orders   Ambulatory referral to Gastroenterology        Return in about 6 months (around 03/12/2023) for chronic disease f/u, virtual ok.     I, Lavon Paganini, MD, have reviewed all documentation for this visit. The documentation on 09/11/22 for the exam, diagnosis, procedures, and orders are all accurate and complete.   Bacigalupo, Dionne Bucy, MD, MPH [facility]

## 2022-09-11 ENCOUNTER — Encounter: Payer: Self-pay | Admitting: Family Medicine

## 2022-09-11 ENCOUNTER — Ambulatory Visit (INDEPENDENT_AMBULATORY_CARE_PROVIDER_SITE_OTHER): Payer: BC Managed Care – PPO | Admitting: Family Medicine

## 2022-09-11 VITALS — BP 107/75 | HR 76 | Temp 98.6°F | Resp 16 | Ht 67.0 in | Wt 216.8 lb

## 2022-09-11 DIAGNOSIS — G4700 Insomnia, unspecified: Secondary | ICD-10-CM

## 2022-09-11 DIAGNOSIS — Z0000 Encounter for general adult medical examination without abnormal findings: Secondary | ICD-10-CM

## 2022-09-11 DIAGNOSIS — Z6833 Body mass index (BMI) 33.0-33.9, adult: Secondary | ICD-10-CM | POA: Diagnosis not present

## 2022-09-11 DIAGNOSIS — Z1211 Encounter for screening for malignant neoplasm of colon: Secondary | ICD-10-CM | POA: Diagnosis not present

## 2022-09-11 DIAGNOSIS — M722 Plantar fascial fibromatosis: Secondary | ICD-10-CM | POA: Insufficient documentation

## 2022-09-11 DIAGNOSIS — E669 Obesity, unspecified: Secondary | ICD-10-CM | POA: Diagnosis not present

## 2022-09-11 DIAGNOSIS — E785 Hyperlipidemia, unspecified: Secondary | ICD-10-CM

## 2022-09-11 DIAGNOSIS — F411 Generalized anxiety disorder: Secondary | ICD-10-CM

## 2022-09-11 DIAGNOSIS — Z23 Encounter for immunization: Secondary | ICD-10-CM

## 2022-09-11 MED ORDER — ATORVASTATIN CALCIUM 40 MG PO TABS: 40.0000 mg | ORAL_TABLET | Freq: Every day | ORAL | 3 refills | Status: DC

## 2022-09-11 MED ORDER — CITALOPRAM HYDROBROMIDE 20 MG PO TABS: 20.0000 mg | ORAL_TABLET | Freq: Every day | ORAL | 3 refills | Status: DC

## 2022-09-11 MED ORDER — ZOLPIDEM TARTRATE 5 MG PO TABS: 2.5000 mg | ORAL_TABLET | Freq: Every evening | ORAL | 5 refills | Status: DC | PRN

## 2022-09-11 NOTE — Assessment & Plan Note (Signed)
Chronic and stable Continue Ambien at current dose

## 2022-09-11 NOTE — Assessment & Plan Note (Signed)
Chronic and well-controlled ?Continue Celexa at current dose ?

## 2022-09-11 NOTE — Assessment & Plan Note (Signed)
Previously well controlled Continue statin Recheck FLP and CMP 

## 2022-09-11 NOTE — Assessment & Plan Note (Signed)
Discussed importance of healthy weight management Discussed diet and exercise  

## 2022-09-11 NOTE — Assessment & Plan Note (Signed)
New problem Mild Bilateral feet Encouraged rest, ice, stretching Discussed frozen water bottle track Home exercise program given Encouraged supportive shoes

## 2022-09-12 LAB — COMPREHENSIVE METABOLIC PANEL
ALT: 15 IU/L (ref 0–32)
AST: 15 IU/L (ref 0–40)
Albumin/Globulin Ratio: 1.6 (ref 1.2–2.2)
Albumin: 4.4 g/dL (ref 3.9–4.9)
Alkaline Phosphatase: 78 IU/L (ref 44–121)
BUN/Creatinine Ratio: 15 (ref 9–23)
BUN: 12 mg/dL (ref 6–24)
Bilirubin Total: 0.5 mg/dL (ref 0.0–1.2)
CO2: 21 mmol/L (ref 20–29)
Calcium: 9.3 mg/dL (ref 8.7–10.2)
Chloride: 106 mmol/L (ref 96–106)
Creatinine, Ser: 0.79 mg/dL (ref 0.57–1.00)
Globulin, Total: 2.7 g/dL (ref 1.5–4.5)
Glucose: 92 mg/dL (ref 70–99)
Potassium: 4.7 mmol/L (ref 3.5–5.2)
Sodium: 142 mmol/L (ref 134–144)
Total Protein: 7.1 g/dL (ref 6.0–8.5)
eGFR: 94 mL/min/{1.73_m2} (ref >59)

## 2022-09-12 LAB — LIPID PANEL WITH LDL/HDL RATIO
Cholesterol, Total: 118 mg/dL (ref 100–199)
HDL: 47 mg/dL (ref >39)
LDL Chol Calc (NIH): 57 mg/dL (ref 0–99)
LDL/HDL Ratio: 1.2 ratio (ref 0.0–3.2)
Triglycerides: 67 mg/dL (ref 0–149)
VLDL Cholesterol Cal: 14 mg/dL (ref 5–40)

## 2022-10-06 ENCOUNTER — Other Ambulatory Visit: Payer: Self-pay | Admitting: Family Medicine

## 2022-10-09 NOTE — Telephone Encounter (Signed)
Requested medication (s) are due for refill today: no  Requested medication (s) are on the active medication list: yes  Last refill:  09/11/22  Future visit scheduled: yes  Notes to clinic:  Unable to refill per protocol, cannot delegate.      Requested Prescriptions  Pending Prescriptions Disp Refills   zolpidem (AMBIEN) 5 MG tablet [Pharmacy Med Name: ZOLPIDEM TARTRATE 5 MG TABLET] 15 tablet 0    Sig: TAKE 1/2 TABLET (2.5 MG TOTAL) BY MOUTH AT BEDTIME AS NEEDED FOR SLEEP     Not Delegated - Psychiatry:  Anxiolytics/Hypnotics Failed - 10/06/2022 10:18 PM      Failed - This refill cannot be delegated      Failed - Urine Drug Screen completed in last 360 days      Passed - Valid encounter within last 6 months    Recent Outpatient Visits           4 weeks ago Annual physical exam   [facility] Erasmo Downer, MD   9 months ago Hyperlipidemia, unspecified hyperlipidemia type   [facility], Marzella Schlein, MD   1 year ago Annual physical exam   [facility], Marzella Schlein, MD   1 year ago GAD (generalized anxiety disorder)   [facility] Bacigalupo, Marzella Schlein, MD   2 years ago Annual physical exam   [facility] Bacigalupo, Marzella Schlein, MD       Future Appointments             In 5 months Bacigalupo, Marzella Schlein, MD [facility]

## 2022-11-07 ENCOUNTER — Telehealth: Payer: BC Managed Care – PPO | Admitting: Physician Assistant

## 2022-11-07 DIAGNOSIS — J019 Acute sinusitis, unspecified: Secondary | ICD-10-CM | POA: Diagnosis not present

## 2022-11-07 DIAGNOSIS — B9689 Other specified bacterial agents as the cause of diseases classified elsewhere: Secondary | ICD-10-CM

## 2022-11-07 MED ORDER — AZITHROMYCIN 250 MG PO TABS: ORAL_TABLET | ORAL | 0 refills | Status: AC

## 2022-11-07 NOTE — Progress Notes (Signed)
I have spent 5 minutes in review of e-visit questionnaire, review and updating patient chart, medical decision making and response to patient.   Annalise Mcdiarmid Cody Shavelle Runkel, PA-C    

## 2022-11-07 NOTE — Progress Notes (Signed)
E-Visit for Sinus Problems  We are sorry that you are not feeling well.  Here is how we plan to help!  Based on what you have shared with me it looks like you have sinusitis.  Sinusitis is inflammation and infection in the sinus cavities of the head.  Based on your presentation I believe you most likely have Acute Bacterial Sinusitis.  This is an infection caused by bacteria and is treated with antibiotics. I have prescribed Azithromycin due to your discussed history.  You may use an oral decongestant such as Mucinex D or if you have glaucoma or high blood pressure use plain Mucinex. Saline nasal spray help and can safely be used as often as needed for congestion.  If you develop worsening sinus pain, fever or notice severe headache and vision changes, or if symptoms are not better after completion of antibiotic, please schedule an appointment with a health care provider.    Sinus infections are not as easily transmitted as other respiratory infection, however we still recommend that you avoid close contact with loved ones, especially the very young and elderly.  Remember to wash your hands thoroughly throughout the day as this is the number one way to prevent the spread of infection!  Home Care: Only take medications as instructed by your medical team. Complete the entire course of an antibiotic. Do not take these medications with alcohol. A steam or ultrasonic humidifier can help congestion.  You can place a towel over your head and breathe in the steam from hot water coming from a faucet. Avoid close contacts especially the very young and the elderly. Cover your mouth when you cough or sneeze. Always remember to wash your hands.  Get Help Right Away If: You develop worsening fever or sinus pain. You develop a severe head ache or visual changes. Your symptoms persist after you have completed your treatment plan.  Make sure you Understand these instructions. Will watch your condition. Will  get help right away if you are not doing well or get worse.  Thank you for choosing an e-visit.  Your e-visit answers were reviewed by a board certified advanced clinical practitioner to complete your personal care plan. Depending upon the condition, your plan could have included both over the counter or prescription medications.  Please review your pharmacy choice. Make sure the pharmacy is open so you can pick up prescription now. If there is a problem, you may contact your provider through [employer] and have the prescription routed to another pharmacy.  Your safety is important to [place]. If you have drug allergies check your prescription carefully.   For the next 24 hours you can use MyChart to ask questions about today's visit, request a non-urgent call back, or ask for a work or school excuse. You will get an email in the next two days asking about your experience. I hope that your e-visit has been valuable and will speed your recovery.

## 2022-12-08 NOTE — Progress Notes (Unsigned)
I,Lilinoe Acklin S Elizibeth Breau,acting as a Education administrator for Lavon Paganini, MD.,have documented all relevant documentation on the behalf of Lavon Paganini, MD,as directed by  Lavon Paganini, MD while in the presence of Lavon Paganini, MD.     Established patient visit   Patient: Melissa Ho   DOB: 03-Nov-1976   47 y.o. Female  MRN: [id number] Visit Date: 12/11/2022  Today's healthcare provider: Lavon Paganini, MD   Chief Complaint  Patient presents with   Foot Pain   Subjective    HPI  Patient C/O worsening right heel pain. She reports having trouble walking and getting through the day.  Making work difficult. Got better in L foot, but worse in R heel. Worse with first steps and then at the end of the day. Has been icing and stretching without relief.  Medications: Outpatient Medications Prior to Visit  Medication Sig   atorvastatin (LIPITOR) 40 MG tablet Take 1 tablet (40 mg total) by mouth daily.   citalopram (CELEXA) 20 MG tablet Take 1 tablet (20 mg total) by mouth daily.   fluticasone (FLONASE) 50 MCG/ACT nasal spray Place 1 spray into both nostrils as needed for allergies or rhinitis.   propranolol (INDERAL) 10 MG tablet TAKE 1 TABLET BY MOUTH EVERY DAY   triamcinolone ointment (KENALOG) 0.5 % Apply 1 application topically 2 (two) times daily.   trimethoprim (TRIMPEX) 100 MG tablet TAKE 1 TABLET (100 MG TOTAL) BY MOUTH AS NEEDED (AFTER INTERCOURSE).   zolpidem (AMBIEN) 5 MG tablet TAKE 1/2 TABLET (2.5 MG TOTAL) BY MOUTH AT BEDTIME AS NEEDED FOR SLEEP   No facility-administered medications prior to visit.    Review of Systems per HPI     Objective    There were no vitals taken for this visit.   Physical Exam Vitals reviewed.  Constitutional:      General: She is not in acute distress.    Appearance: She is well-developed.  HENT:     Head: Normocephalic and atraumatic.  Eyes:     General: No scleral icterus.    Conjunctiva/sclera: Conjunctivae normal.   Cardiovascular:     Rate and Rhythm: Normal rate and regular rhythm.  Pulmonary:     Effort: Pulmonary effort is normal. No respiratory distress.  Musculoskeletal:     Right lower leg: No edema.     Left lower leg: No edema.     Comments: TTP over R plantar fascia. Normal ROM, no ankle TTP over bony landmarks, no achilles TTP  Skin:    General: Skin is warm and dry.     Findings: No rash.  Neurological:     Mental Status: She is alert and oriented to person, place, and time.  Psychiatric:        Behavior: Behavior normal.      No results found for any visits on 12/11/22.  Assessment & Plan     Problem List Items Addressed This Visit       Musculoskeletal and Integument   Plantar fasciitis - Primary    Resolved in L foot, but worse on R Has been doing conservative management with ice, stretching, supportive shoes Will refer to podiatry for further eval/management      Relevant Orders   Ambulatory referral to Podiatry     No follow-ups on file.      I, Lavon Paganini, MD, have reviewed all documentation for this visit. The documentation on 12/11/22 for the exam, diagnosis, procedures, and orders are all accurate and complete.   Lavon Paganini  M, MD, MPH [facility]

## 2022-12-11 ENCOUNTER — Ambulatory Visit (INDEPENDENT_AMBULATORY_CARE_PROVIDER_SITE_OTHER): Payer: BC Managed Care – PPO | Admitting: Family Medicine

## 2022-12-11 DIAGNOSIS — M722 Plantar fascial fibromatosis: Secondary | ICD-10-CM | POA: Diagnosis not present

## 2022-12-11 NOTE — Assessment & Plan Note (Signed)
Resolved in L foot, but worse on R Has been doing conservative management with ice, stretching, supportive shoes Will refer to podiatry for further eval/management

## 2022-12-20 ENCOUNTER — Other Ambulatory Visit: Payer: Self-pay | Admitting: Family Medicine

## 2022-12-27 ENCOUNTER — Ambulatory Visit (INDEPENDENT_AMBULATORY_CARE_PROVIDER_SITE_OTHER): Payer: BC Managed Care – PPO

## 2022-12-27 ENCOUNTER — Ambulatory Visit (INDEPENDENT_AMBULATORY_CARE_PROVIDER_SITE_OTHER): Payer: BC Managed Care – PPO | Admitting: Podiatry

## 2022-12-27 ENCOUNTER — Encounter: Payer: Self-pay | Admitting: Podiatry

## 2022-12-27 DIAGNOSIS — M722 Plantar fascial fibromatosis: Secondary | ICD-10-CM | POA: Diagnosis not present

## 2022-12-27 MED ORDER — MELOXICAM 15 MG PO TABS: 15.0000 mg | ORAL_TABLET | Freq: Every day | ORAL | 3 refills | Status: DC

## 2022-12-27 MED ORDER — METHYLPREDNISOLONE 4 MG PO TBPK: ORAL_TABLET | ORAL | 0 refills | Status: DC

## 2022-12-27 MED ORDER — TRIAMCINOLONE ACETONIDE 40 MG/ML IJ SUSP
20.0000 mg | Freq: Once | INTRAMUSCULAR | Status: AC
  Administered 2022-12-27: 20 mg

## 2022-12-27 NOTE — Progress Notes (Signed)
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 female presents with the above complaint.   ROS: She denies fever chills nausea vomiting muscle aches pains calf pain back pain chest pain shortness of breath.  Past Medical History:  Diagnosis Date   Allergy    Anxiety    Irregular heartbeat    Migraine    Solitary pulmonary nodule 10/06/2019   Past Surgical History:  Procedure Laterality Date   NO PAST SURGERIES      Current Outpatient Medications:    meloxicam (MOBIC) 15 MG tablet, Take 1 tablet (15 mg total) by mouth daily., Disp: 30 tablet, Rfl: 3   methylPREDNISolone (MEDROL DOSEPAK) 4 MG TBPK tablet, 6 day dose pack - take as directed, Disp: 21 tablet, Rfl: 0   atorvastatin (LIPITOR) 40 MG tablet, Take 1 tablet (40 mg total) by mouth daily., Disp: 90 tablet, Rfl: 3   citalopram (CELEXA) 20 MG tablet, Take 1 tablet (20 mg total) by mouth daily., Disp: 90 tablet, Rfl: 3   fluticasone (FLONASE) 50 MCG/ACT nasal spray, Place 1 spray into both nostrils as needed for allergies or rhinitis., Disp: , Rfl:    propranolol (INDERAL) 10 MG tablet, TAKE 1 TABLET BY MOUTH EVERY DAY, Disp: 90 tablet, Rfl: 1   triamcinolone ointment (KENALOG) 0.5 %, Apply 1 application topically 2 (two) times daily., Disp: 30 g, Rfl: 5   trimethoprim (TRIMPEX) 100 MG tablet, TAKE 1 TABLET (100 MG TOTAL) BY MOUTH AS NEEDED (AFTER INTERCOURSE)., Disp: 30 tablet, Rfl: 3   zolpidem (AMBIEN) 5 MG tablet, TAKE 1/2 TABLET (2.5 MG TOTAL) BY MOUTH AT BEDTIME AS NEEDED FOR SLEEP, Disp: 15 tablet, Rfl: 2  No Known Allergies Review of Systems Objective:  There were no vitals  filed for this visit.  General: Well developed, nourished, in no acute distress, alert and oriented x3   Dermatological: Skin is warm, dry and supple bilateral. Nails x 10 are well maintained; remaining integument appears unremarkable at this time. There are no open sores, no preulcerative lesions, no rash or signs of infection present.  Vascular: Dorsalis Pedis artery and Posterior Tibial artery pedal pulses are 2/4 bilateral with immedate capillary fill time. Pedal hair growth present. No varicosities and no lower extremity edema present bilateral.   Neruologic: Grossly intact via light touch bilateral. Vibratory intact via tuning fork bilateral. Protective threshold with Semmes Wienstein monofilament intact to all pedal sites bilateral. Patellar and Achilles deep tendon reflexes 2+ bilateral. No Babinski or clonus noted bilateral.   Musculoskeletal: No gross boney pedal deformities bilateral. No pain, crepitus, or limitation noted with foot and ankle range of motion bilateral. Muscular strength 5/5 in all groups tested bilateral.  She has pain on palpation medial calcaneal tubercle of the right heel.  She has no pain on medial lateral compression of the calcaneus.  Gait: Unassisted, Nonantalgic.    Radiographs:  Radiographs taken today demonstrate an osseously mature individual with good mineralization soft tissue increase in density plantar fascial Caney insertion site of the right heel.  Mild pes planus is noted no significant osseous abnormalities in the area.  Assessment & Plan:   Assessment: Planter fasciitis right foot.  Plan: Discussed etiology pathology conservative versus surgical therapies.  At this point I injected her right heel 20 mg of Kenalog 5 mg Marcaine point maximal tenderness.  Tolerated procedure well without complications.  Started her on methylprednisolone to be followed by meloxicam.  Placed in a plantar fascial brace and a night splint.  Discussed appropriate shoe  gear stretching exercises ice therapy and shoe gear modifications.  Will follow-up with her in about 6 weeks     Nadira [place], [place]

## 2022-12-27 NOTE — Patient Instructions (Signed)

## 2023-01-05 ENCOUNTER — Other Ambulatory Visit: Payer: Self-pay | Admitting: Family Medicine

## 2023-01-24 ENCOUNTER — Ambulatory Visit (INDEPENDENT_AMBULATORY_CARE_PROVIDER_SITE_OTHER): Payer: BC Managed Care – PPO | Admitting: Podiatry

## 2023-01-24 ENCOUNTER — Encounter: Payer: Self-pay | Admitting: Podiatry

## 2023-01-24 DIAGNOSIS — M722 Plantar fascial fibromatosis: Secondary | ICD-10-CM | POA: Diagnosis not present

## 2023-01-24 NOTE — Progress Notes (Signed)
She presents today for follow-up of her Planter fasciitis of her right foot.  She states that she is doing quite well.  Objective: Vital signs are stable she is alert and oriented x 3 there is no erythema edema salines drainage or odor she has some minimal pain on palpation medial calcaneal tubercle.  Assessment: Resolving Planter fasciitis.  Plan: Discussed etiology pathology conservative versus surgical therapies she can continue the use of the meloxicam and her good shoe gear and I will follow-up with her in 5 weeks

## 2023-01-30 ENCOUNTER — Other Ambulatory Visit: Payer: Self-pay | Admitting: Family Medicine

## 2023-01-30 DIAGNOSIS — Z1231 Encounter for screening mammogram for malignant neoplasm of breast: Secondary | ICD-10-CM

## 2023-02-26 ENCOUNTER — Ambulatory Visit: Payer: BC Managed Care – PPO | Admitting: Podiatry

## 2023-02-27 ENCOUNTER — Ambulatory Visit
Admission: RE | Admit: 2023-02-27 | Discharge: 2023-02-27 | Disposition: A | Discharge status: Home / Self Care | Payer: BC Managed Care – PPO | Source: Ambulatory Visit | Attending: Family Medicine | Admitting: Family Medicine

## 2023-02-27 DIAGNOSIS — Z1231 Encounter for screening mammogram for malignant neoplasm of breast: Secondary | ICD-10-CM | POA: Diagnosis not present

## 2023-03-13 ENCOUNTER — Telehealth: Payer: BC Managed Care – PPO | Admitting: Family Medicine

## 2023-03-14 NOTE — Progress Notes (Unsigned)
MyChart Video Visit    Virtual Visit via Video Note   This format is felt to be most appropriate for this patient at this time. Physical exam was limited by quality of the video and audio technology used for the visit.   Patient location: *** Provider location: ***  I discussed the limitations of evaluation and management by telemedicine and the availability of in person appointments. The patient expressed understanding and agreed to proceed.  Patient: Melissa Ho   DOB: 1976/07/17   47 y.o. Female  MRN: [id number] Visit Date: 03/15/2023  Today's healthcare provider: Shirlee Latch, MD   No chief complaint on file.  Subjective    HPI  Anxiety, Follow-up  She was last seen for anxiety 6 months ago. Changes made at last visit include continue Celexa 20 mg daily.   She reports {excellent/good/fair/poor:19665} compliance with treatment. She reports {good/fair/poor:18685} tolerance of treatment. She {is/is not:21021397} having side effects. {document side effects if present:1}  She feels her anxiety is {Desc; severity:60313} and {improved/worse/unchanged:3041574} since last visit.  GAD-7 Results    01/09/2022    8:34 AM 01/03/2021    8:46 AM 04/12/2020    9:02 AM  GAD-7 Generalized Anxiety Disorder Screening Tool  1. Feeling Nervous, Anxious, or on Edge 1 0 1  2. Not Being Able to Stop or Control Worrying 1 0 1  3. Worrying Too Much About Different Things 0 0 1  4. Trouble Relaxing 1 0 0  5. Being So Restless it's Hard To Sit Still 1 0 0  6. Becoming Easily Annoyed or Irritable 0 0 1  7. Feeling Afraid As If Something Awful Might Happen 0 0 0  Total GAD-7 Score 4 0 4  Difficulty At Work, Home, or Getting  Along With Others? Not difficult at all Not difficult at all Somewhat difficult    PHQ-9 Scores    12/11/2022    2:45 PM 09/11/2022    9:42 AM 01/09/2022    8:38 AM  PHQ9 SCORE ONLY  PHQ-9 Total Score 4 2 3     --------------------------------------------------------------------------------------------------- Follow up for insomnia  The patient was last seen for this 6 months ago. Changes made at last visit include continue Ambien.  She reports {excellent/good/fair/poor:19665} compliance with treatment. She feels that condition is {improved/worse/unchanged:3041574}. She {is/is not:21021397} having side effects. ***  ----------------------------------------------------------------------------------------- Lipid/Cholesterol, Follow-up  Last lipid panel Other pertinent labs  Lab Results  Component Value Date   CHOL 118 09/11/2022   HDL 47 09/11/2022   LDLCALC 57 09/11/2022   TRIG 67 09/11/2022   CHOLHDL 2.1 07/06/2020   Lab Results  Component Value Date   ALT 15 09/11/2022   AST 15 09/11/2022   PLT 238 07/06/2020     She was last seen for this 6 months ago.  Management since that visit includes no changes. Continue atorvastatin 40 mg daily.  She reports {excellent/good/fair/poor:19665} compliance with treatment. She {is/is not:9024} having side effects. {document side effects if present:1}   The ASCVD Risk score (Arnett DK, et al., 2019) failed to calculate for the following reasons:   The valid total cholesterol range is 130 to 320 mg/dL  ---------------------------------------------------------------------------------------------------    Medications: Outpatient Medications Prior to Visit  Medication Sig   atorvastatin (LIPITOR) 40 MG tablet Take 1 tablet (40 mg total) by mouth daily.   citalopram (CELEXA) 20 MG tablet Take 1 tablet (20 mg total) by mouth daily.   fluticasone (FLONASE) 50 MCG/ACT nasal spray Place 1 spray into both  nostrils as needed for allergies or rhinitis.   meloxicam (MOBIC) 15 MG tablet Take 1 tablet (15 mg total) by mouth daily.   methylPREDNISolone (MEDROL DOSEPAK) 4 MG TBPK tablet 6 day dose pack - take as directed   propranolol (INDERAL) 10 MG  tablet TAKE 1 TABLET BY MOUTH EVERY DAY   triamcinolone ointment (KENALOG) 0.5 % Apply 1 application topically 2 (two) times daily.   trimethoprim (TRIMPEX) 100 MG tablet TAKE 1 TABLET (100 MG TOTAL) BY MOUTH AS NEEDED (AFTER INTERCOURSE).   zolpidem (AMBIEN) 5 MG tablet TAKE 1/2 TABLET (2.5 MG TOTAL) BY MOUTH AT BEDTIME AS NEEDED FOR SLEEP   No facility-administered medications prior to visit.    Review of Systems  Constitutional:  Negative for chills, diaphoresis and fatigue.  Eyes:  Negative for visual disturbance.  Respiratory:  Negative for chest tightness and shortness of breath.   Cardiovascular:  Negative for chest pain and leg swelling.  Gastrointestinal:  Negative for abdominal pain, nausea and vomiting.  Neurological:  Negative for dizziness, light-headedness and headaches.  Psychiatric/Behavioral:  Negative for agitation and sleep disturbance. The patient is not nervous/anxious.     {Labs  Heme  Chem  Endocrine  Serology  Results Review (optional):23779}   Objective    LMP 02/11/2023   {Show previous vital signs (optional):23777}   Physical Exam     Assessment & Plan     ***  No follow-ups on file.     I discussed the assessment and treatment plan with the patient. The patient was provided an opportunity to ask questions and all were answered. The patient agreed with the plan and demonstrated an understanding of the instructions.   The patient was advised to call back or seek an in-person evaluation if the symptoms worsen or if the condition fails to improve as anticipated.  I provided *** minutes of non-face-to-face time during this encounter.  {provider attestation***:1}  Shirlee Latch, MD [facility] [phone] (phone) [phone] (fax)  [facility]

## 2023-03-15 ENCOUNTER — Encounter: Payer: Self-pay | Admitting: Family Medicine

## 2023-03-15 ENCOUNTER — Telehealth (INDEPENDENT_AMBULATORY_CARE_PROVIDER_SITE_OTHER): Payer: BC Managed Care – PPO | Admitting: Family Medicine

## 2023-03-15 VITALS — Ht 67.0 in | Wt 215.0 lb

## 2023-03-15 DIAGNOSIS — N390 Urinary tract infection, site not specified: Secondary | ICD-10-CM

## 2023-03-15 DIAGNOSIS — E785 Hyperlipidemia, unspecified: Secondary | ICD-10-CM | POA: Diagnosis not present

## 2023-03-15 DIAGNOSIS — M722 Plantar fascial fibromatosis: Secondary | ICD-10-CM | POA: Diagnosis not present

## 2023-03-15 DIAGNOSIS — G4700 Insomnia, unspecified: Secondary | ICD-10-CM

## 2023-03-15 DIAGNOSIS — F411 Generalized anxiety disorder: Secondary | ICD-10-CM | POA: Diagnosis not present

## 2023-03-15 NOTE — Assessment & Plan Note (Signed)
Treatment was expensive Still having some pain, but getting better Still doing HEP, rest, icing

## 2023-03-15 NOTE — Assessment & Plan Note (Signed)
H/o recurrent UTIs postcoital ?Well controlled ?Continue trimethoprim prn postcoitally ?

## 2023-03-15 NOTE — Assessment & Plan Note (Signed)
Previously well controlled Continue Atorvastatin at current dose Recheck FLP and CMP annually

## 2023-03-15 NOTE — Assessment & Plan Note (Signed)
Chronic and stable Continue Ambien at current dose - only taking as needed

## 2023-03-15 NOTE — Assessment & Plan Note (Signed)
Chronic and well-controlled ?Continue Celexa at current dose ?

## 2023-04-05 ENCOUNTER — Other Ambulatory Visit: Payer: Self-pay | Admitting: Family Medicine

## 2023-04-06 NOTE — Telephone Encounter (Signed)
Requested medication (s) are due for refill today: Yes  Requested medication (s) are on the active medication list: Yes  Last refill:  01/08/23  Future visit scheduled: No  Notes to clinic:  See request.    Requested Prescriptions  Pending Prescriptions Disp Refills   zolpidem (AMBIEN) 5 MG tablet [Pharmacy Med Name: ZOLPIDEM TARTRATE 5 MG TABLET] 15 tablet 2    Sig: TAKE 1/2 TABLET (2.5 MG TOTAL) BY MOUTH AT BEDTIME AS NEEDED FOR SLEEP     Not Delegated - Psychiatry:  Anxiolytics/Hypnotics Failed - 04/05/2023 10:24 PM      Failed - This refill cannot be delegated      Failed - Urine Drug Screen completed in last 360 days      Passed - Valid encounter within last 6 months    Recent Outpatient Visits           3 weeks ago Insomnia, unspecified type   [place] [facility] [place], Marzella Schlein, MD   3 months ago Plantar fasciitis   [place] [facility] [place], Marzella Schlein, MD   6 months ago Annual physical exam   [place] [facility] [place], Marzella Schlein, MD   1 year ago Hyperlipidemia, unspecified hyperlipidemia type   [facility], Marzella Schlein, MD   1 year ago Annual physical exam   [facility] [place], Marzella Schlein, MD

## 2023-05-02 ENCOUNTER — Other Ambulatory Visit: Payer: Self-pay | Admitting: Podiatry

## 2023-06-04 ENCOUNTER — Telehealth: Payer: BC Managed Care – PPO | Admitting: Physician Assistant

## 2023-06-04 DIAGNOSIS — J019 Acute sinusitis, unspecified: Secondary | ICD-10-CM | POA: Diagnosis not present

## 2023-06-04 DIAGNOSIS — B9689 Other specified bacterial agents as the cause of diseases classified elsewhere: Secondary | ICD-10-CM | POA: Diagnosis not present

## 2023-06-04 MED ORDER — AMOXICILLIN-POT CLAVULANATE 875-125 MG PO TABS
1.0000 | ORAL_TABLET | Freq: Two times a day (BID) | ORAL | 0 refills | Status: DC
Start: 2023-06-04 — End: 2023-06-11

## 2023-06-04 MED ORDER — PREDNISONE 20 MG PO TABS
40.0000 mg | ORAL_TABLET | Freq: Every day | ORAL | 0 refills | Status: DC
Start: 2023-06-04 — End: 2023-06-11

## 2023-06-04 NOTE — Progress Notes (Signed)

## 2023-06-04 NOTE — Addendum Note (Signed)
Addended by: Margaretann Loveless on: 06/04/2023 04:07 PM   Modules accepted: Orders

## 2023-06-04 NOTE — Progress Notes (Signed)
I have spent 5 minutes in review of e-visit questionnaire, review and updating patient chart, medical decision making and response to patient.   William Cody Martin, PA-C    

## 2023-06-11 ENCOUNTER — Telehealth (INDEPENDENT_AMBULATORY_CARE_PROVIDER_SITE_OTHER): Payer: BC Managed Care – PPO | Admitting: Family Medicine

## 2023-06-11 ENCOUNTER — Other Ambulatory Visit: Payer: Self-pay | Admitting: Family Medicine

## 2023-06-11 ENCOUNTER — Encounter: Payer: Self-pay | Admitting: Family Medicine

## 2023-06-11 DIAGNOSIS — J0141 Acute recurrent pansinusitis: Secondary | ICD-10-CM | POA: Diagnosis not present

## 2023-06-11 MED ORDER — AZITHROMYCIN 250 MG PO TABS: ORAL_TABLET | ORAL | 0 refills | Status: DC

## 2023-06-11 MED ORDER — PREDNISONE 20 MG PO TABS: 40.0000 mg | ORAL_TABLET | Freq: Every day | ORAL | 0 refills | Status: DC

## 2023-06-11 NOTE — Progress Notes (Signed)
MyChart Video Visit    Virtual Visit via Video Note   This format is felt to be most appropriate for this patient at this time. Physical exam was limited by quality of the video and audio technology used for the visit.    Patient location: home Provider location: [facility] Persons involved in the visit: patient, provider   I discussed the limitations of evaluation and management by telemedicine and the availability of in person appointments. The patient expressed understanding and agreed to proceed.  Patient: Melissa Ho   DOB: [birth year]   47 y.o. Female  MRN: [id number] Visit Date: 06/11/2023  Today's healthcare provider: Shirlee Latch, MD   Chief Complaint  Patient presents with   URI   Subjective    URI    HPI   Patient was prescribed Augmentin and prednisone on 06/04/23. She has completed course of ABX and prednisone. She reports that right after she completed prednisone she had a rebound of sinus pressure, headache,ear pressure sinus and sinus drainage. She denies any cough, shortness of breath, wheezing or fever.  Last edited by Myles Lipps, CMA on 06/11/2023 10:02 AM.      Discussed the use of AI scribe software for clinical note transcription with the patient, who gave verbal consent to proceed.  History of Present Illness   The patient, with a history of recurrent sinusitis, presents with a rebound of sinus pressure, ear pressure, and headache after completing a course of Augmentin and prednisone. They report that their symptoms initially improved while on the medications, but worsened significantly after finishing the prednisone. 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 They have been using ice packs for relief and are concerned about an upcoming flight due to their symptoms. The patient has a history of responding well to Z-Pak for previous sinus  infections, but feels that their body no longer responds to amoxicillin.        Medications: Outpatient Medications Prior to Visit  Medication Sig   atorvastatin (LIPITOR) 40 MG tablet Take 1 tablet (40 mg total) by mouth daily.   citalopram (CELEXA) 20 MG tablet Take 1 tablet (20 mg total) by mouth daily.   fluticasone (FLONASE) 50 MCG/ACT nasal spray Place 1 spray into both nostrils as needed for allergies or rhinitis.   meloxicam (MOBIC) 15 MG tablet TAKE 1 TABLET (15 MG TOTAL) BY MOUTH DAILY.   propranolol (INDERAL) 10 MG tablet TAKE 1 TABLET BY MOUTH EVERY DAY   triamcinolone ointment (KENALOG) 0.5 % Apply 1 application topically 2 (two) times daily.   trimethoprim (TRIMPEX) 100 MG tablet TAKE 1 TABLET (100 MG TOTAL) BY MOUTH AS NEEDED (AFTER INTERCOURSE).   zolpidem (AMBIEN) 5 MG tablet TAKE 1/2 TABLET (2.5 MG TOTAL) BY MOUTH AT BEDTIME AS NEEDED FOR SLEEP   [DISCONTINUED] amoxicillin-clavulanate (AUGMENTIN) 875-125 MG tablet Take 1 tablet by mouth 2 (two) times daily.   [DISCONTINUED] predniSONE (DELTASONE) 20 MG tablet Take 2 tablets (40 mg total) by mouth daily with breakfast.   No facility-administered medications prior to visit.    Review of Systems per HPI      Objective    There were no vitals taken for this visit.      Physical Exam Constitutional:      General: She is not in acute distress.    Appearance: Normal appearance.  HENT:     Head: Normocephalic.  Pulmonary:  Effort: Pulmonary effort is normal. No respiratory distress.  Neurological:     Mental Status: She is alert and oriented to person, place, and time. Mental status is at baseline.        Assessment & Plan     Problem List Items Addressed This Visit   None Visit Diagnoses     Acute recurrent pansinusitis    -  Primary   Relevant Medications   azithromycin (ZITHROMAX) 250 MG tablet   predniSONE (DELTASONE) 20 MG tablet           Sinusitis Recurrence of symptoms after  completion of Augmentin and Prednisone. Patient reports sinus pressure, ear pressure, and headache. Patient has a history of responding better to Z-Pak. -Start Z-Pak (Azithromycin) 5-day course. -Repeat Prednisone 5-day course to reduce inflammation quickly due to patient's work and travel requirements. -Continue Flonase and Aleve cold and sinus for symptom relief. - If not improving, consider switching to Doxycycline. -If symptoms recur after completion of Prednisone, consider starting Doxycycline. -Continue to monitor and adjust future treatment based on response to Z-Pak vs Augmentin.      Meds ordered this encounter  Medications   azithromycin (ZITHROMAX) 250 MG tablet    Sig: Take 500mg  PO daily x1d and then 250mg  daily x4 days    Dispense:  6 each    Refill:  0   predniSONE (DELTASONE) 20 MG tablet    Sig: Take 2 tablets (40 mg total) by mouth daily with breakfast.    Dispense:  10 tablet    Refill:  0       Return if symptoms worsen or fail to improve.     I discussed the assessment and treatment plan with the patient. The patient was provided an opportunity to ask questions and all were answered. The patient agreed with the plan and demonstrated an understanding of the instructions.   The patient was advised to call back or seek an in-person evaluation if the symptoms worsen or if the condition fails to improve as anticipated.   Shirlee Latch, MD [facility] [phone] (phone) [phone] (fax)  [facility]

## 2023-07-06 ENCOUNTER — Other Ambulatory Visit: Payer: Self-pay | Admitting: Family Medicine

## 2023-07-10 NOTE — Telephone Encounter (Signed)
Requested medication (s) are due for refill today: yes  Requested medication (s) are on the active medication list: yes  Last refill:  04/06/23  Future visit scheduled: no  Notes to clinic:  Unable to refill per protocol, cannot delegate.      Requested Prescriptions  Pending Prescriptions Disp Refills   zolpidem (AMBIEN) 5 MG tablet [Pharmacy Med Name: ZOLPIDEM TARTRATE 5 MG TABLET] 15 tablet 2    Sig: TAKE 1/2 TABLET (2.5 MG TOTAL) BY MOUTH AT BEDTIME AS NEEDED FOR SLEEP     Not Delegated - Psychiatry:  Anxiolytics/Hypnotics Failed - 07/06/2023 10:31 PM      Failed - This refill cannot be delegated      Failed - Urine Drug Screen completed in last 360 days      Passed - Valid encounter within last 6 months    Recent Outpatient Visits           4 weeks ago Acute recurrent pansinusitis   [facility] [place], Marzella Schlein, MD   3 months ago Insomnia, unspecified type   [facility] [place], Marzella Schlein, MD   7 months ago Plantar fasciitis   [place] [facility] [place], Marzella Schlein, MD   10 months ago Annual physical exam   [facility] [place], Marzella Schlein, MD   1 year ago Hyperlipidemia, unspecified hyperlipidemia type   [facility] [place], Marzella Schlein, MD

## 2023-09-03 IMAGING — MG MM DIGITAL SCREENING BILAT W/ TOMO AND CAD
6 of 10 series · 6 of 30 positions shown · non-contrast
Comparison: Previous exam(s).

CLINICAL DATA: Screening.

EXAM:
DIGITAL SCREENING BILATERAL MAMMOGRAM WITH TOMOSYNTHESIS AND CAD
TECHNIQUE: Bilateral screening digital craniocaudal and mediolateral oblique
mammograms were obtained. Bilateral screening digital breast
tomosynthesis was performed. The images were evaluated with
computer-aided detection.

[R CC synth-2D]
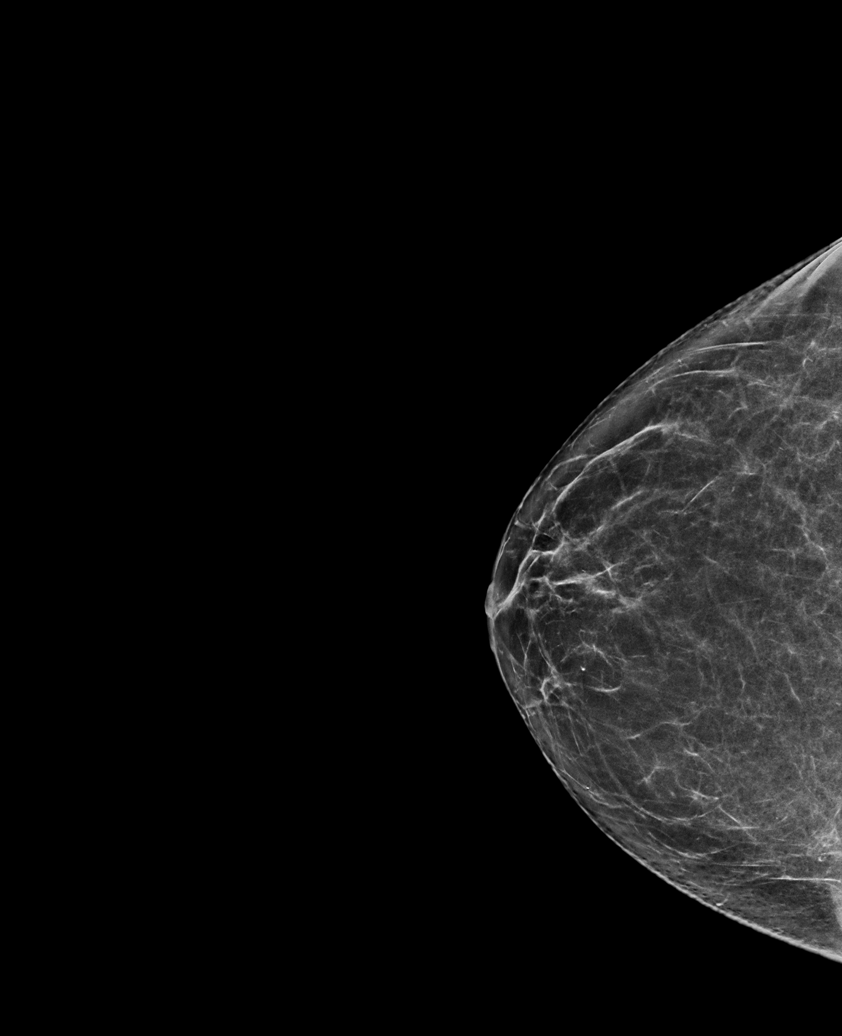

[L MLO synth-2D (1 of 2)]
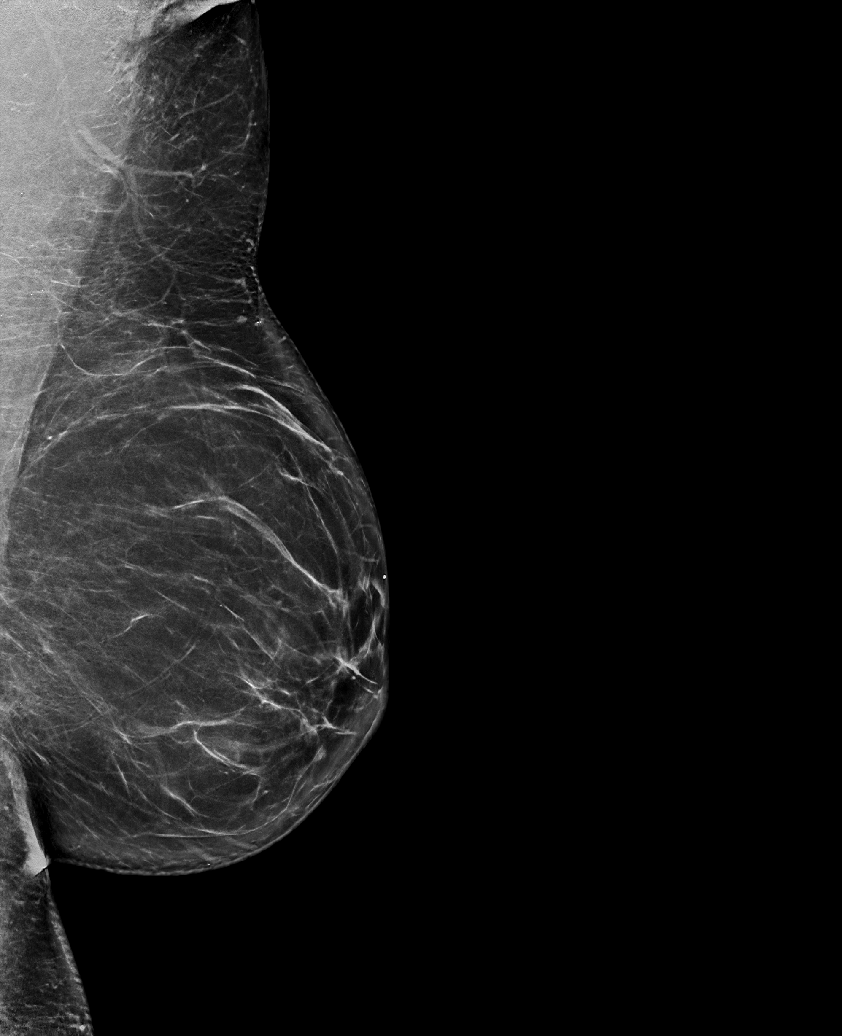

[L CC synth-2D]
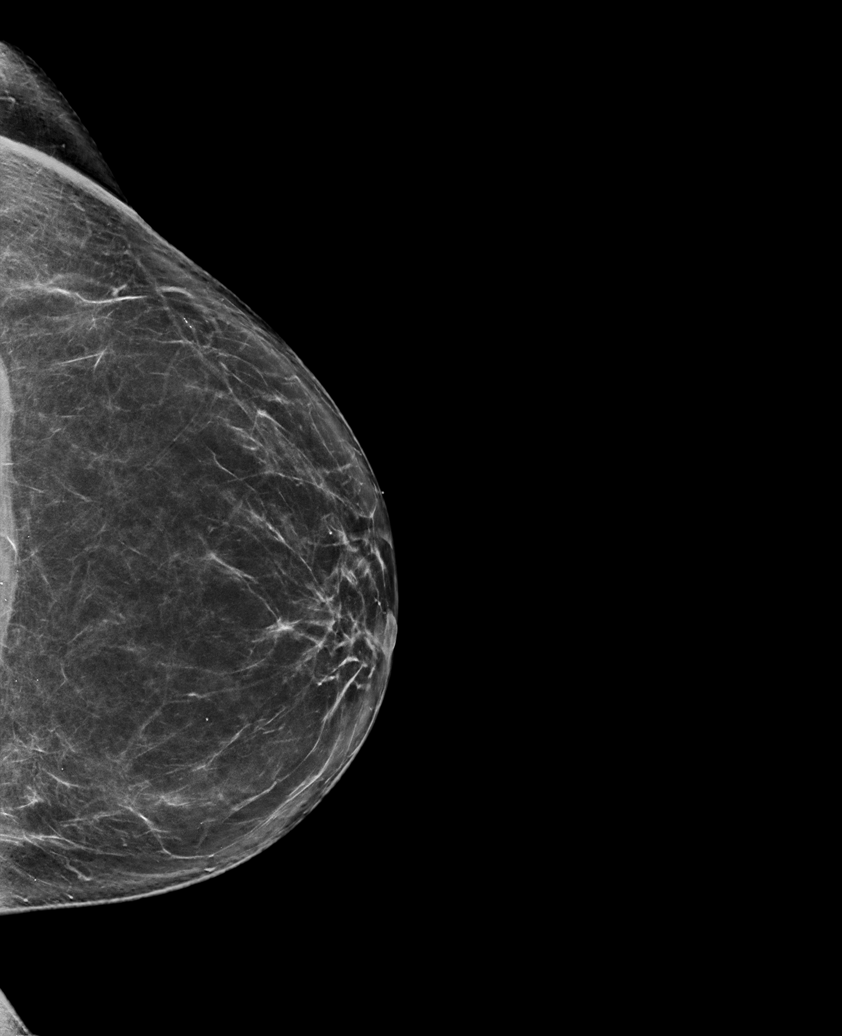

[R MLO synth-2D]
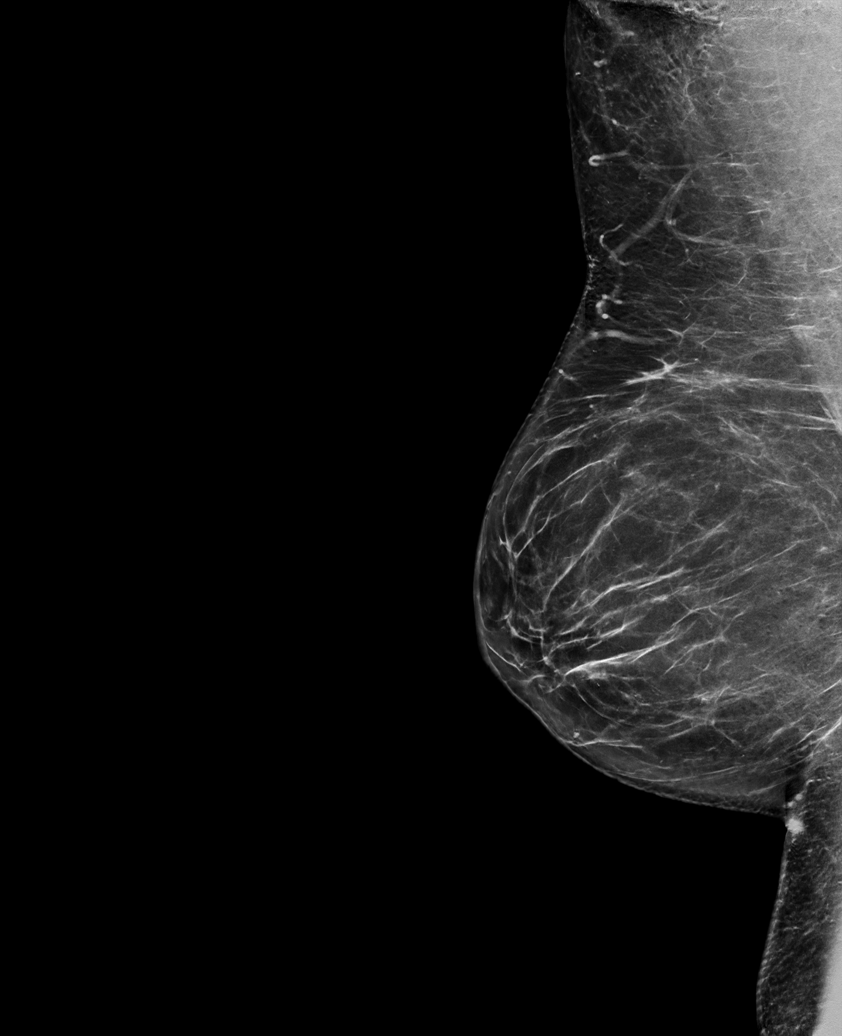

[L MLO synth-2D (2 of 2)]
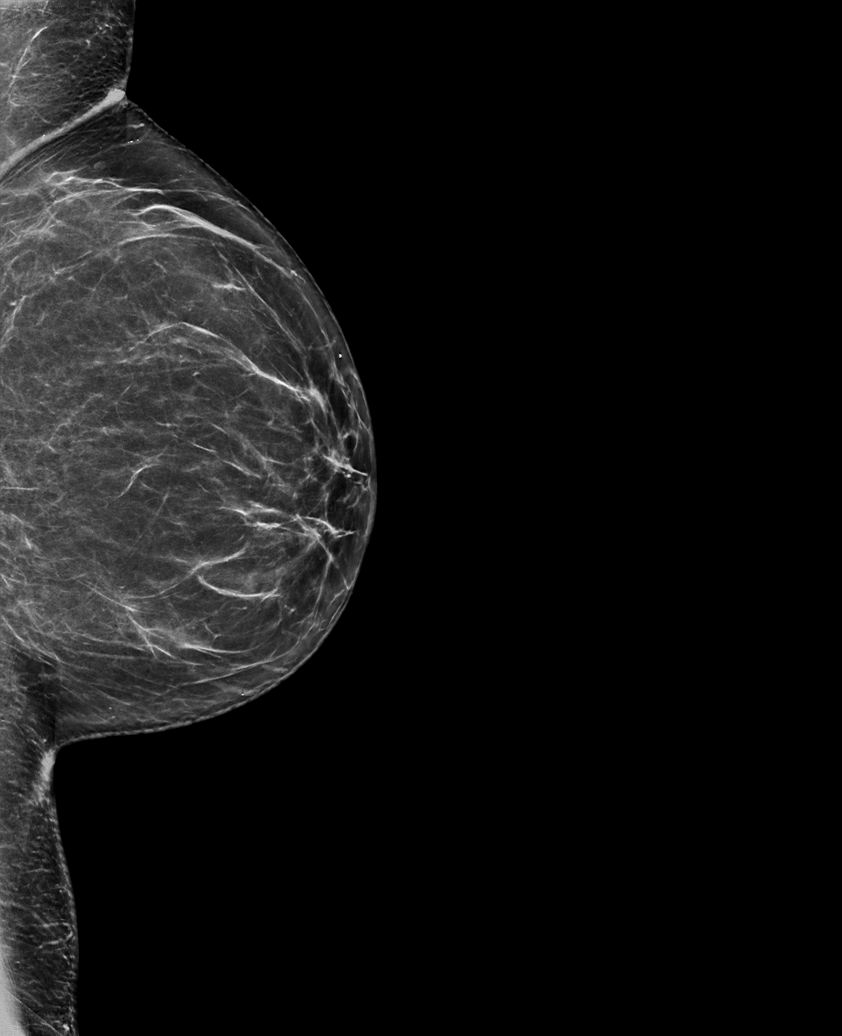

[L MLO tomo · tomo slice 41/80.0]
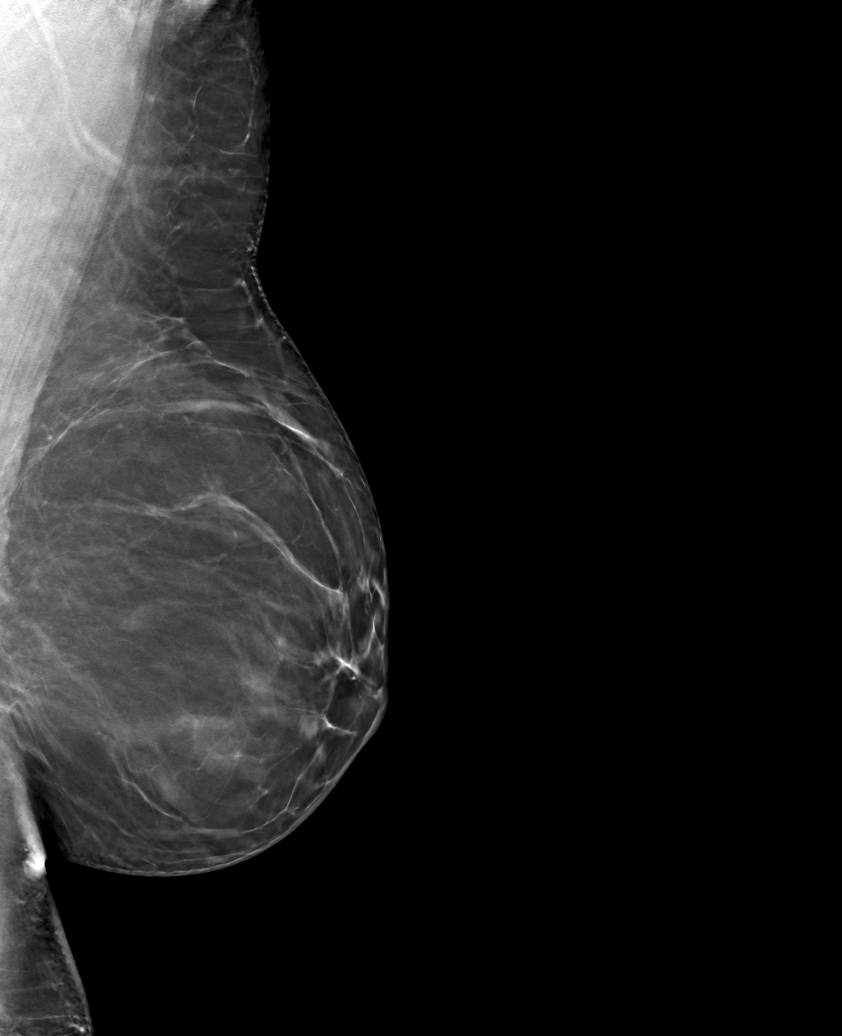

[6 of 30 positions shown; findings below may reference images not displayed]

ACR Breast Density Category b: There are scattered areas of
fibroglandular density.
FINDINGS: There are no findings suspicious for malignancy.
IMPRESSION: No mammographic evidence of malignancy. A result letter of this
screening mammogram will be mailed directly to the patient.

RECOMMENDATION:
Screening mammogram in one year. (Code:51-O-LD2)

BI-RADS CATEGORY  1: Negative.

## 2023-09-04 ENCOUNTER — Other Ambulatory Visit: Payer: Self-pay | Admitting: Podiatry

## 2023-09-05 ENCOUNTER — Other Ambulatory Visit: Payer: Self-pay | Admitting: Family Medicine

## 2023-09-05 NOTE — Telephone Encounter (Signed)
Requested Prescriptions  Pending Prescriptions Disp Refills   citalopram (CELEXA) 20 MG tablet [Pharmacy Med Name: CITALOPRAM HBR 20 MG TABLET] 90 tablet 0    Sig: TAKE 1 TABLET BY MOUTH EVERY DAY     Psychiatry:  Antidepressants - SSRI Passed - 09/05/2023  1:28 AM      Passed - Valid encounter within last 6 months    Recent Outpatient Visits           2 months ago Acute recurrent pansinusitis   [place] [facility] [place], Marzella Schlein, MD   5 months ago Insomnia, unspecified type   [facility] [place], Marzella Schlein, MD   8 months ago Plantar fasciitis   [place] [facility] [place], Marzella Schlein, MD   11 months ago Annual physical exam   [facility] [place], Marzella Schlein, MD   1 year ago Hyperlipidemia, unspecified hyperlipidemia type   [facility] [place], Marzella Schlein, MD       Future Appointments             In 5 days [place], Marzella Schlein, MD [facility]

## 2023-09-10 ENCOUNTER — Ambulatory Visit (INDEPENDENT_AMBULATORY_CARE_PROVIDER_SITE_OTHER): Payer: BC Managed Care – PPO | Admitting: Family Medicine

## 2023-09-10 ENCOUNTER — Encounter: Payer: Self-pay | Admitting: Family Medicine

## 2023-09-10 VITALS — BP 111/77 | HR 64 | Temp 98.7°F | Resp 16 | Ht 67.0 in | Wt 222.2 lb

## 2023-09-10 DIAGNOSIS — R635 Abnormal weight gain: Secondary | ICD-10-CM

## 2023-09-10 DIAGNOSIS — E66811 Obesity, class 1: Secondary | ICD-10-CM

## 2023-09-10 DIAGNOSIS — F411 Generalized anxiety disorder: Secondary | ICD-10-CM | POA: Diagnosis not present

## 2023-09-10 DIAGNOSIS — R5383 Other fatigue: Secondary | ICD-10-CM | POA: Diagnosis not present

## 2023-09-10 DIAGNOSIS — R102 Pelvic and perineal pain: Secondary | ICD-10-CM

## 2023-09-10 DIAGNOSIS — E785 Hyperlipidemia, unspecified: Secondary | ICD-10-CM

## 2023-09-10 DIAGNOSIS — Z6834 Body mass index (BMI) 34.0-34.9, adult: Secondary | ICD-10-CM

## 2023-09-10 DIAGNOSIS — R239 Unspecified skin changes: Secondary | ICD-10-CM

## 2023-09-10 DIAGNOSIS — G4700 Insomnia, unspecified: Secondary | ICD-10-CM

## 2023-09-10 NOTE — Progress Notes (Signed)
Established Patient Office Visit  Subjective   Patient ID: Melissa Ho, female    DOB: June 08, 1976  Age: 47 y.o. MRN: [id number]  Chief Complaint  Patient presents with   Weight Gain   Fatigue   Pain lower abdomen, Bilateral   Mole, lower middle, back    HPI  Discussed the use of AI scribe software for clinical note transcription with the patient, who gave verbal consent to proceed.  History of Present Illness   The patient, in her mid-forties, presents with weight gain, fatigue, belly pain, and a mole on the lower back. The patient attributes the weight gain and fatigue to perimenopause, noting that her periods have become irregular, with heavy bleeding one month and minimal bleeding the next. Despite maintaining the same level of physical activity and diet, the patient has noticed a steady increase in weight over the past few years. The patient's diet includes carbohydrates, which she acknowledges as a guilty pleasure. The patient's job involves lifting heavy objects, which she believes contributes to her physical activity. The patient also mentions experiencing carpal tunnel syndrome, which has recently worsened due to a work project that involved extensive use of a caulking gun underwater. The patient has been managing the condition with meloxicam and occasionally uses a brace. The patient also reports experiencing night sweats and disrupted sleep, often waking up after four to five hours and struggling to fall back asleep. The patient has a mole on her lower back that has recently become raised, causing concern. The patient has a history of taking atorvastatin, Celexa, and Ambien. The patient has not yet undergone colon cancer screening.         ROS    Objective:     BP 111/77 (BP Location: Left Arm, Patient Position: Sitting, Cuff Size: Large)   Pulse 64   Temp 98.7 F (37.1 C) (Oral)   Resp 16   Ht 5\' 7"  (1.702 m)   Wt 222 lb 3.2 oz (100.8 kg)   BMI 34.80 kg/m     Physical Exam Vitals reviewed.  Constitutional:      General: She is not in acute distress.    Appearance: Normal appearance. She is well-developed. She is not diaphoretic.  HENT:     Head: Normocephalic and atraumatic.  Eyes:     General: No scleral icterus.    Conjunctiva/sclera: Conjunctivae normal.  Neck:     Thyroid: No thyromegaly.  Cardiovascular:     Rate and Rhythm: Normal rate and regular rhythm.     Heart sounds: Normal heart sounds. No murmur heard. Pulmonary:     Effort: Pulmonary effort is normal. No respiratory distress.     Breath sounds: Normal breath sounds. No wheezing, rhonchi or rales.  Musculoskeletal:     Cervical back: Neck supple.     Right lower leg: No edema.     Left lower leg: No edema.  Lymphadenopathy:     Cervical: No cervical adenopathy.  Skin:    General: Skin is warm and dry.     Findings: No rash.     Comments: Multiple SKs on back, one newly raised center  Neurological:     Mental Status: She is alert and oriented to person, place, and time. Mental status is at baseline.  Psychiatric:        Mood and Affect: Mood normal.        Behavior: Behavior normal.      No results found for any visits on 09/10/23.  The ASCVD Risk score (Arnett DK, et al., 2019) failed to calculate for the following reasons:   The valid total cholesterol range is 130 to 320 mg/dL    Assessment & Plan:   Problem List Items Addressed This Visit       Other   Insomnia   GAD (generalized anxiety disorder)   Hyperlipidemia   Relevant Orders   Comprehensive metabolic panel   Lipid panel   Obesity   Other Visit Diagnoses     Weight gain    -  Primary   Relevant Orders   CBC with Differential/Platelet   Comprehensive metabolic panel   Vitamin B12   VITAMIN D 25 Hydroxy (Vit-D Deficiency, Fractures)   TSH   Fatigue, unspecified type       Relevant Orders   CBC with Differential/Platelet   Comprehensive metabolic panel   Vitamin B12    VITAMIN D 25 Hydroxy (Vit-D Deficiency, Fractures)   TSH   Pelvic pain       Relevant Orders   US Pelvic Complete With Transvaginal   Skin change       Relevant Orders   Ambulatory referral to Dermatology           Perimenopause Reports of irregular periods, weight gain, and fatigue. Discussed the physiological changes during perimenopause and the need for dietary adjustments and weight training. -Order labs to rule out underlying causes of fatigue and weight gain (thyroid, anemia, kidney and liver function, vitamin D and B12). -Advise on dietary changes (less carbs, higher protein) and weight training.  Abdominal Pain Reports of intermittent right lower quadrant pain, possibly related to menstrual cycle. -Order pelvic ultrasound to rule out ovarian cysts or other pathology.  Skin Lesion Raised mole on mid back, no color change. likely SK, but has changed recently -Refer to dermatology for evaluation and possible biopsy.  Anxiety and Insomnia Currently managed with Celexa 20mg  daily and Ambien 0.5 pill as needed. -Continue current medications.  Hyperlipidemia Currently managed with Atorvastatin. -Check lipid panel.  Colon Cancer Screening Due for colon cancer screening. -Plan to discuss further at next visit in six months.  Follow-up Plan for physical exam in six months.        Return in about 6 months (around 03/09/2024) for CPE.    Shirlee Latch, MD

## 2023-09-11 ENCOUNTER — Other Ambulatory Visit: Payer: Self-pay

## 2023-09-11 DIAGNOSIS — E559 Vitamin D deficiency, unspecified: Secondary | ICD-10-CM

## 2023-09-11 LAB — COMPREHENSIVE METABOLIC PANEL
ALT: 15 [IU]/L (ref 0–32)
AST: 17 [IU]/L (ref 0–40)
Albumin: 4.4 g/dL (ref 3.9–4.9)
Alkaline Phosphatase: 90 [IU]/L (ref 44–121)
BUN/Creatinine Ratio: 12 (ref 9–23)
BUN: 10 mg/dL (ref 6–24)
Bilirubin Total: 0.6 mg/dL (ref 0.0–1.2)
CO2: 22 mmol/L (ref 20–29)
Calcium: 9.1 mg/dL (ref 8.7–10.2)
Chloride: 104 mmol/L (ref 96–106)
Creatinine, Ser: 0.84 mg/dL (ref 0.57–1.00)
Globulin, Total: 2.2 g/dL (ref 1.5–4.5)
Glucose: 83 mg/dL (ref 70–99)
Potassium: 4.6 mmol/L (ref 3.5–5.2)
Sodium: 140 mmol/L (ref 134–144)
Total Protein: 6.6 g/dL (ref 6.0–8.5)
eGFR: 87 mL/min/{1.73_m2} (ref >59)

## 2023-09-11 LAB — LIPID PANEL
Chol/HDL Ratio: 2.3 ratio (ref 0.0–4.4)
Cholesterol, Total: 118 mg/dL (ref 100–199)
HDL: 52 mg/dL (ref >39)
LDL Chol Calc (NIH): 53 mg/dL (ref 0–99)
Triglycerides: 57 mg/dL (ref 0–149)
VLDL Cholesterol Cal: 13 mg/dL (ref 5–40)

## 2023-09-11 LAB — CBC WITH DIFFERENTIAL/PLATELET
Basophils Absolute: 0.1 10*3/uL (ref 0.0–0.2)
Basos: 1 %
EOS (ABSOLUTE): 0.1 10*3/uL (ref 0.0–0.4)
Eos: 2 %
Hematocrit: 39.5 % (ref 34.0–46.6)
Hemoglobin: 12.6 g/dL (ref 11.1–15.9)
Immature Grans (Abs): 0 10*3/uL (ref 0.0–0.1)
Immature Granulocytes: 1 %
Lymphocytes Absolute: 2.4 10*3/uL (ref 0.7–3.1)
Lymphs: 28 %
MCH: 28 pg (ref 26.6–33.0)
MCHC: 31.9 g/dL (ref 31.5–35.7)
MCV: 88 fL (ref 79–97)
Monocytes Absolute: 0.6 10*3/uL (ref 0.1–0.9)
Monocytes: 7 %
Neutrophils Absolute: 5.4 10*3/uL (ref 1.4–7.0)
Neutrophils: 61 %
Platelets: 291 10*3/uL (ref 150–450)
RBC: 4.5 x10E6/uL (ref 3.77–5.28)
RDW: 11.9 % (ref 11.7–15.4)
WBC: 8.7 10*3/uL (ref 3.4–10.8)

## 2023-09-11 LAB — VITAMIN B12: Vitamin B-12: 294 pg/mL (ref 232–1245)

## 2023-09-11 LAB — TSH: TSH: 1.3 u[IU]/mL (ref 0.450–4.500)

## 2023-09-11 LAB — VITAMIN D 25 HYDROXY (VIT D DEFICIENCY, FRACTURES): Vit D, 25-Hydroxy: 8.5 ng/mL — ABNORMAL LOW (ref 30.0–100.0)

## 2023-09-11 MED ORDER — VITAMIN D (ERGOCALCIFEROL) 1.25 MG (50000 UNIT) PO CAPS: 50000.0000 [IU] | ORAL_CAPSULE | ORAL | 0 refills | Status: DC

## 2023-09-17 ENCOUNTER — Ambulatory Visit
Admission: RE | Admit: 2023-09-17 | Discharge: 2023-09-17 | Disposition: A | Discharge status: Home / Self Care | Payer: BC Managed Care – PPO | Source: Ambulatory Visit | Attending: Family Medicine | Admitting: Family Medicine

## 2023-09-17 DIAGNOSIS — R9389 Abnormal findings on diagnostic imaging of other specified body structures: Secondary | ICD-10-CM | POA: Diagnosis not present

## 2023-09-17 DIAGNOSIS — N888 Other specified noninflammatory disorders of cervix uteri: Secondary | ICD-10-CM | POA: Diagnosis not present

## 2023-09-17 DIAGNOSIS — R102 Pelvic and perineal pain: Secondary | ICD-10-CM | POA: Diagnosis not present

## 2023-09-21 ENCOUNTER — Ambulatory Visit: Payer: Self-pay | Admitting: *Deleted

## 2023-09-21 ENCOUNTER — Other Ambulatory Visit: Payer: Self-pay

## 2023-09-21 DIAGNOSIS — D259 Leiomyoma of uterus, unspecified: Secondary | ICD-10-CM

## 2023-09-21 DIAGNOSIS — R102 Pelvic and perineal pain unspecified side: Secondary | ICD-10-CM

## 2023-09-21 NOTE — Telephone Encounter (Signed)
  Chief Complaint: requesting results and would like further explanation what it means from [place] results Symptoms: irregular periods  Frequency: na  Pertinent Negatives: Patient denies na Disposition: [] ED /[] Urgent Care (no appt availability in office) / [] Appointment(In office/virtual)/ []  [facility]/ [] Home Care/ [] Refused Recommended Disposition /[] [facility]/ [x]  Follow-up with PCP Additional Notes: Pt given lab results per notes of Dr. Suzan Slick from 09/21/23 on 09/21/23. Pt verbalized understanding but would like more review of what results mean / or explanation of why fibroids occurring and " what it means". Patient reports her periods are irregular and unsure when to schedule [place] in 6- 8 weeks. Please advise.    Reason for Disposition  [1] Caller requesting NON-URGENT health information AND [2] PCP's office is the best resource  Answer Assessment - Initial Assessment Questions 1. REASON FOR CALL or QUESTION: "What is your reason for calling today?" or "How can I best help you?" or "What question do you have that I can help answer?"     Called back requesting results.  Protocols used: Information Only Call - No Triage-A-AH

## 2023-09-24 NOTE — Telephone Encounter (Signed)
Fibroids are dense benign growths in the uterus.  Her's is just over a centimeter in diameter, which is small.  They are very common.  They can cause bleeding and pain, but only typically when they are much larger.  Can talk to schedulers when they schedule her [place] regarding timing or if she'd be able to let them know when she starts her period, so we'd know when to schedule.  Alternatively, we can send her to GYN for further eval. They can likely do [place] in office, which would allow possibly more flexibility in scheduling around period.

## 2023-09-24 NOTE — Addendum Note (Signed)
Addended by: Erasmo Downer on: 09/24/2023 09:37 AM   Modules accepted: Orders

## 2023-09-24 NOTE — Addendum Note (Signed)
Addended by: Lily Kocher on: 09/24/2023 09:29 AM   Modules accepted: Orders

## 2023-09-30 ENCOUNTER — Other Ambulatory Visit: Payer: Self-pay | Admitting: Family Medicine

## 2023-10-02 NOTE — Telephone Encounter (Signed)
Requested Prescriptions  Pending Prescriptions Disp Refills   atorvastatin (LIPITOR) 40 MG tablet [Pharmacy Med Name: ATORVASTATIN 40 MG TABLET] 90 tablet 3    Sig: TAKE 1 TABLET BY MOUTH EVERY DAY     Cardiovascular:  Antilipid - Statins Failed - 09/30/2023  8:50 AM      Failed - Lipid Panel in normal range within the last 12 months    Cholesterol, Total  Date Value Ref Range Status  09/10/2023 118 100 - 199 mg/dL Final   LDL Chol Calc (NIH)  Date Value Ref Range Status  09/10/2023 53 0 - 99 mg/dL Final   HDL  Date Value Ref Range Status  09/10/2023 52 >39 mg/dL Final   Triglycerides  Date Value Ref Range Status  09/10/2023 57 0 - 149 mg/dL Final         Passed - Patient is not pregnant      Passed - Valid encounter within last 12 months    Recent Outpatient Visits           3 weeks ago Weight gain   [facility] [place], Marzella Schlein, MD   3 months ago Acute recurrent pansinusitis   [facility] [place], Marzella Schlein, MD   6 months ago Insomnia, unspecified type   [facility] [place], Marzella Schlein, MD   9 months ago Plantar fasciitis   [place] [facility] [place], Marzella Schlein, MD   1 year ago Annual physical exam   [place] [facility] [place], Marzella Schlein, MD       Future Appointments             In 5 months Bacigalupo, Marzella Schlein, MD [facility], [facility]

## 2023-10-06 ENCOUNTER — Other Ambulatory Visit: Payer: Self-pay | Admitting: Family Medicine

## 2023-10-08 NOTE — Telephone Encounter (Signed)
Requested medication (s) are due for refill today - yes  Requested medication (s) are on the active medication list -yes  Future visit scheduled -yes  Last refill: 07/10/23 #15 2RF  Notes to clinic: non delegated Rx  Requested Prescriptions  Pending Prescriptions Disp Refills   zolpidem (AMBIEN) 5 MG tablet [Pharmacy Med Name: ZOLPIDEM TARTRATE 5 MG TABLET] 15 tablet 2    Sig: TAKE 1/2 TABLET (2.5 MG TOTAL) BY MOUTH AT BEDTIME AS NEEDED FOR SLEEP     Not Delegated - Psychiatry:  Anxiolytics/Hypnotics Failed - 10/06/2023 11:10 PM      Failed - This refill cannot be delegated      Failed - Urine Drug Screen completed in last 360 days      Passed - Valid encounter within last 6 months    Recent Outpatient Visits           4 weeks ago Weight gain   [facility] [place], Marzella Schlein, MD   3 months ago Acute recurrent pansinusitis   [place] [facility] [place], Marzella Schlein, MD   6 months ago Insomnia, unspecified type   [facility] [place], Marzella Schlein, MD   10 months ago Plantar fasciitis   [place] [facility] [place], Marzella Schlein, MD   1 year ago Annual physical exam   [place] [facility] [place], Marzella Schlein, MD       Future Appointments             In 5 months Bacigalupo, Marzella Schlein, MD [facility], [facility]               Requested Prescriptions  Pending Prescriptions Disp Refills   zolpidem (AMBIEN) 5 MG tablet [Pharmacy Med Name: ZOLPIDEM TARTRATE 5 MG TABLET] 15 tablet 2    Sig: TAKE 1/2 TABLET (2.5 MG TOTAL) BY MOUTH AT BEDTIME AS NEEDED FOR SLEEP     Not Delegated - Psychiatry:  Anxiolytics/Hypnotics Failed - 10/06/2023 11:10 PM      Failed - This refill cannot be delegated      Failed - Urine Drug Screen completed in last 360 days      Passed - Valid encounter within last 6 months    Recent Outpatient Visits           4  weeks ago Weight gain   [facility] [place], Marzella Schlein, MD   3 months ago Acute recurrent pansinusitis   [place] [place], Marzella Schlein, MD   6 months ago Insomnia, unspecified type   [facility] [place], Marzella Schlein, MD   10 months ago Plantar fasciitis   [facility] [place], Marzella Schlein, MD   1 year ago Annual physical exam   [place] [facility] [place], Marzella Schlein, MD       Future Appointments             In 5 months Bacigalupo, Marzella Schlein, MD [facility], [facility]

## 2023-10-19 NOTE — Progress Notes (Unsigned)
GYNECOLOGY PROGRESS NOTE: NEW PATIENT   Subjective:  PCP: Erasmo Downer, MD  Patient ID: Melissa Ho, female    DOB: Dec 04, 1975, 47 y.o.   MRN: [id number]  HPI  Patient is a 47 y.o. G0P0000 female who is referred by Shirlee Latch MD for bilateral pelvic pain on and off for one year. Comes more frequently when pt is on her period. Is not constant, describes as a dull ache, and waxes and wanes. PCP ordered a pelvic US to check for ovarian cysts, done 09/21/23, EMT 19mm and 1.4cm subserosal fibroid in L ant fundus, no cysts. Radiology recommended pt repeat US the week after menses for better assessment of EMT. PCP has followed up and scheduled this already, is having done soon. Pt unsure why she is actually here today, but does have some questions about EMT and the fibroid. Her periods are regular monthly and are not extremely heavy. No intermenstrual bleeding. Denies missing periods.   Period Cycle (Days): 25 Period Duration (Days): 5-10 Period Pattern: Regular Menstrual Flow: Heavy, Moderate, Light Menstrual Control Change Freq (Hours): 3 Dysmenorrhea: (!) Severe Dysmenorrhea Symptoms: Cramping  OB History     Gravida  0   Para  0   Term  0   Preterm  0   AB  0   Living  0      SAB  0   IAB  0   Ectopic  0   Multiple  0   Live Births  0          Past Medical History:  Diagnosis Date   Allergy    Anxiety    Irregular heartbeat    Migraine    Solitary pulmonary nodule 10/06/2019   Past Surgical History:  Procedure Laterality Date   NO PAST SURGERIES     Family History  Problem Relation Age of Onset   Breast cancer Mother 57       x's 2   Heart attack Father        thought to be related to agent orange exposure   Stroke Father    Polymyalgia rheumatica Father    Leukemia Father        LGL T-Cell   Lung cancer Other        paternal uncle x2, smokers   Breast cancer Cousin    Colon cancer Neg Hx    Social History   Socioeconomic  History   Marital status: Married    Spouse name: Not on file   Number of children: 0   Years of education: Not on file   Highest education level: Not on file  Occupational History   Occupation: Research officer, political party  Tobacco Use   Smoking status: Former    Current packs/day: 0.00    Average packs/day: 0.5 packs/day for 16.0 years (8.0 ttl pk-yrs)    Types: Cigarettes    Start date: 09/06/1994    Quit date: 09/06/2010    Years since quitting: 13.1   Smokeless tobacco: Never  Vaping Use   Vaping status: Never Used  Substance and Sexual Activity   Alcohol use: Yes    Alcohol/week: 4.0 standard drinks of alcohol    Types: 4 Glasses of wine per week   Drug use: Never   Sexual activity: Yes    Partners: Male    Birth control/protection: None    Comment: husband had vasectomy  Other Topics Concern   Not on file  Social History Narrative  Not on file   Social Drivers of Health   Financial Resource Strain: Not on file  Food Insecurity: Not on file  Transportation Needs: Not on file  Physical Activity: Not on file  Stress: Not on file  Social Connections: Unknown (03/21/2022)   [facility]   Social Network    Social Network: Not on file  Intimate Partner Violence: Unknown (02/10/2022)   Received from [facility]   HITS    Physically Hurt: Not on file    Insult or Talk Down To: Not on file    Threaten Physical Harm: Not on file    Scream or Curse: Not on file   Patient Active Problem List   Diagnosis Date Noted   Plantar fasciitis 09/11/2022   Eczema 01/09/2022   Leg length discrepancy 07/12/2021   Recurrent UTI 07/06/2020   Insomnia 10/06/2019   GAD (generalized anxiety disorder) 10/06/2019   Hyperlipidemia 10/06/2019   Family history of breast cancer 10/06/2019   Seasonal allergic rhinitis due to pollen 10/06/2019   Chronic migraine without aura without status migrainosus, not intractable 10/06/2019   Obesity  10/06/2019   Current Outpatient Medications on File Prior to Visit  Medication Sig Dispense Refill   atorvastatin (LIPITOR) 40 MG tablet TAKE 1 TABLET BY MOUTH EVERY DAY 90 tablet 3   citalopram (CELEXA) 20 MG tablet TAKE 1 TABLET BY MOUTH EVERY DAY 90 tablet 0   fluticasone (FLONASE) 50 MCG/ACT nasal spray Place 1 spray into both nostrils as needed for allergies or rhinitis.     meloxicam (MOBIC) 15 MG tablet TAKE 1 TABLET (15 MG TOTAL) BY MOUTH DAILY. 30 tablet 3   propranolol (INDERAL) 10 MG tablet TAKE 1 TABLET BY MOUTH EVERY DAY 90 tablet 1   triamcinolone ointment (KENALOG) 0.5 % Apply 1 application topically 2 (two) times daily. 30 g 5   trimethoprim (TRIMPEX) 100 MG tablet TAKE 1 TABLET (100 MG TOTAL) BY MOUTH AS NEEDED (AFTER INTERCOURSE). 30 tablet 3   Vitamin D, Ergocalciferol, (DRISDOL) 1.25 MG (50000 UNIT) CAPS capsule Take 1 capsule (50,000 Units total) by mouth every 7 (seven) days. 12 capsule 0   zolpidem (AMBIEN) 5 MG tablet TAKE 1/2 TABLET (2.5 MG TOTAL) BY MOUTH AT BEDTIME AS NEEDED FOR SLEEP 15 tablet 2   No current facility-administered medications on file prior to visit.   No Known Allergies  The following portions of the patient's history were reviewed and updated as appropriate: allergies, current medications, past family history, past medical history, past social history, past surgical history, and problem list.  Review of Systems Pertinent items are noted in HPI.   Objective:   Blood pressure 110/68, pulse (!) 178, height 5\' 7"  (1.702 m), weight 225 lb (102.1 kg), last menstrual period 10/16/2023. Body mass index is 35.24 kg/m.  General appearance: alert, cooperative, and mildly obese Abdomen: soft, non-tender; bowel sounds normal; no masses,  no organomegaly Pelvic: deferred Extremities: extremities normal, atraumatic, no cyanosis or edema Neurologic: Grossly normal  IMPRESSION: 1. 1.4 cm subserosal fibroid at the left anterior fundus. 2. Endometrial  stripe thickened up to 19 mm, considered abnormal. Consider follow-up by [place] in 6-8 weeks, during the week immediately following menses (exam timing is critical). 3. Normal sonographic appearance of the ovaries.  No adnexal mass. 4. Trace free fluid within pelvis, nonspecific, but most commonly physiologic.  Assessment/Plan:   1. Fibroid    Melissa Ho is a 47 y.o. G0P0000 with intermittent pelvic pain  for the past year, investigated by her PCP with pelvic US and found to have a 1.4cm subserosal fibroid in the L ant fundus and EMT of 19mm, already scheduled via PCP to have follow up US week after menses to reeval EMT.  -Discussed fibroid and unlikely cause of her pain. Also reviewed EMT is insignificant in a regularly menstruating, premenopausal patient, but she may follow up with [place] for thoroughness. May be that patient is experiencing Mittleschmerz pain, we discussed this at length and patient feels she may fit. Reviewed home remedies she can try to alleviate the discomfort when it occurs and if persists or worsens, feel free to follow up with [place] for further evaluation. She may also want to discuss this pain further with her PCP if it continues bothering her and/or changes. She communicated that it overall does not bother her and she just brought it up for completeness sake during her PCP visit. Overall the [place] is very reassuring that her uterus and ovaries and healthy and would not be the source of her discomfort. Follow up as needed. Thank you for allowing [place] to be involved in your patient's care.    Julieanne Manson, DO [facility]

## 2023-10-22 ENCOUNTER — Encounter: Payer: Self-pay | Admitting: Obstetrics

## 2023-10-22 ENCOUNTER — Ambulatory Visit (INDEPENDENT_AMBULATORY_CARE_PROVIDER_SITE_OTHER): Payer: BC Managed Care – PPO | Admitting: Obstetrics

## 2023-10-22 VITALS — BP 110/68 | HR 178 | Ht 67.0 in | Wt 225.0 lb

## 2023-10-22 DIAGNOSIS — D219 Benign neoplasm of connective and other soft tissue, unspecified: Secondary | ICD-10-CM

## 2023-10-22 DIAGNOSIS — D259 Leiomyoma of uterus, unspecified: Secondary | ICD-10-CM | POA: Diagnosis not present

## 2023-10-24 ENCOUNTER — Encounter: Payer: Self-pay | Admitting: Family Medicine

## 2023-10-26 ENCOUNTER — Ambulatory Visit: Payer: BC Managed Care – PPO

## 2023-11-22 ENCOUNTER — Telehealth (INDEPENDENT_AMBULATORY_CARE_PROVIDER_SITE_OTHER): Payer: BC Managed Care – PPO | Admitting: Family Medicine

## 2023-11-22 ENCOUNTER — Encounter: Payer: Self-pay | Admitting: Family Medicine

## 2023-11-22 DIAGNOSIS — B9789 Other viral agents as the cause of diseases classified elsewhere: Secondary | ICD-10-CM

## 2023-11-22 DIAGNOSIS — J0140 Acute pansinusitis, unspecified: Secondary | ICD-10-CM | POA: Diagnosis not present

## 2023-11-22 MED ORDER — AZITHROMYCIN 250 MG PO TABS: ORAL_TABLET | ORAL | 0 refills | Status: DC

## 2023-11-22 MED ORDER — PREDNISONE 20 MG PO TABS: 40.0000 mg | ORAL_TABLET | Freq: Every day | ORAL | 0 refills | Status: AC

## 2023-11-22 NOTE — Progress Notes (Signed)
MyChart Video Visit    Virtual Visit via Video Note   This format is felt to be most appropriate for this patient at this time. Physical exam was limited by quality of the video and audio technology used for the visit.    Patient location: home Provider location: [facility] Persons involved in the visit: patient, provider  I discussed the limitations of evaluation and management by telemedicine and the availability of in person appointments. The patient expressed understanding and agreed to proceed.  Patient: Melissa Ho   DOB: 28-Apr-1976   48 y.o. Female  MRN: [id number] Visit Date: 11/22/2023  Today's healthcare provider: Shirlee Latch, MD   No chief complaint on file.  Subjective    HPI   Discussed the use of AI scribe software for clinical note transcription with the patient, who gave verbal consent to proceed.  History of Present Illness   The patient, with a history of recurrent sinus infections, presents with symptoms consistent with another sinus infection. They report onset of symptoms around [place], following a visit from their parents during which their father fell ill. The patient's symptoms include congestion, sinus pain, and constant ear popping. The pain is localized above the eyes and into the temples. They have been using Flonase and taking ibuprofen for symptom management, but have not seen improvement. The patient's last sinus infection was in August, which did not respond to Augmentin and prednisone, but improved with azithromycin.         Review of Systems      Objective    There were no vitals taken for this visit.      Physical Exam Constitutional:      General: She is not in acute distress.    Appearance: Normal appearance.  HENT:     Head: Normocephalic.  Pulmonary:     Effort: Pulmonary effort is normal. No respiratory distress.  Neurological:     Mental Status: She is alert and oriented to person,  place, and time. Mental status is at baseline.        Assessment & Plan     Problem List Items Addressed This Visit   None Visit Diagnoses       Acute non-recurrent pansinusitis    -  Primary   Relevant Medications   azithromycin (ZITHROMAX) 250 MG tablet   predniSONE (DELTASONE) 20 MG tablet           Sinusitis Recurrent sinusitis since New Year's, likely secondary to exposure to an ill family member. Symptoms include congestion, sinus pain above the eyes, into the temples, and ear popping. Previous treatment with Augmentin and prednisone was ineffective; azithromycin (Z-Pak) was effective. Discussed that while population studies indicate Z-Paks are generally not highly effective for sinus infections, it has worked well for this patient in the past. Alternative treatment with doxycycline was mentioned if Z-Pak is ineffective. - Prescribe azithromycin (Z-Pak) for 5 days: 2 pills on the first day, then 1 pill daily. - Prescribe prednisone: 2 pills with breakfast daily for 7 days. - Send prescriptions to [facility]. - Advise to contact if symptoms do not improve.       Meds ordered this encounter  Medications   azithromycin (ZITHROMAX) 250 MG tablet    Sig: Take 500mg  PO daily x1d and then 250mg  daily x4 days    Dispense:  6 each    Refill:  0   predniSONE (DELTASONE) 20 MG tablet    Sig: Take 2 tablets (40  mg total) by mouth daily with breakfast for 7 days.    Dispense:  14 tablet    Refill:  0     Return if symptoms worsen or fail to improve.     I discussed the assessment and treatment plan with the patient. The patient was provided an opportunity to ask questions and all were answered. The patient agreed with the plan and demonstrated an understanding of the instructions.   The patient was advised to call back or seek an in-person evaluation if the symptoms worsen or if the condition fails to improve as anticipated.   Shirlee Latch, MD [facility] [phone] (phone) [phone] (fax)  [facility]

## 2023-12-01 ENCOUNTER — Other Ambulatory Visit: Payer: Self-pay | Admitting: Family Medicine

## 2023-12-03 NOTE — Telephone Encounter (Signed)
Requested Prescriptions  Pending Prescriptions Disp Refills   propranolol (INDERAL) 10 MG tablet [Pharmacy Med Name: PROPRANOLOL 10 MG TABLET] 90 tablet 0    Sig: TAKE 1 TABLET BY MOUTH EVERY DAY     Cardiovascular:  Beta Blockers Failed - 12/03/2023  3:16 PM      Failed - Last Heart Rate in normal range    Pulse Readings from Last 1 Encounters:  10/22/23 (!) 178         Passed - Last BP in normal range    BP Readings from Last 1 Encounters:  10/22/23 110/68         Passed - Valid encounter within last 6 months    Recent Outpatient Visits           1 week ago Acute non-recurrent pansinusitis   [place] [facility] [place], Marzella Schlein, MD   2 months ago Weight gain   [facility] [place], Marzella Schlein, MD   5 months ago Acute recurrent pansinusitis   [place] [facility] [place], Marzella Schlein, MD   8 months ago Insomnia, unspecified type   [facility] [place], Marzella Schlein, MD   11 months ago Plantar fasciitis   [place] [facility] [place], Marzella Schlein, MD       Future Appointments             In 3 months Bacigalupo, Marzella Schlein, MD [facility]   In 5 months Terri Piedra, DO [facility]             citalopram (CELEXA) 20 MG tablet [Pharmacy Med Name: CITALOPRAM HBR 20 MG TABLET] 90 tablet 0    Sig: TAKE 1 TABLET BY MOUTH EVERY DAY     Psychiatry:  Antidepressants - SSRI Passed - 12/03/2023  3:16 PM      Passed - Valid encounter within last 6 months    Recent Outpatient Visits           1 week ago Acute non-recurrent pansinusitis   [place] [facility] [place], Marzella Schlein, MD   2 months ago Weight gain   [facility] [place], Marzella Schlein, MD   5 months ago Acute recurrent pansinusitis   [place] [place], Marzella Schlein, MD   8 months ago  Insomnia, unspecified type   [facility] [place], Marzella Schlein, MD   11 months ago Plantar fasciitis   [place] [facility] [place], Marzella Schlein, MD       Future Appointments             In 3 months Bacigalupo, Marzella Schlein, MD [facility]   In 5 months Terri Piedra, DO [facility]

## 2023-12-29 ENCOUNTER — Other Ambulatory Visit: Payer: Self-pay | Admitting: Podiatry

## 2024-01-05 ENCOUNTER — Other Ambulatory Visit: Payer: Self-pay | Admitting: Family Medicine

## 2024-01-07 NOTE — Telephone Encounter (Signed)
 Requested medication (s) are due for refill today - yes  Requested medication (s) are on the active medication list -yes  Future visit scheduled -yes  Last refill: 10/08/23 #15 2RF  Notes to clinic: non delegated Rx  Requested Prescriptions  Pending Prescriptions Disp Refills   zolpidem (AMBIEN) 5 MG tablet [Pharmacy Med Name: ZOLPIDEM TARTRATE 5 MG TABLET] 15 tablet 2    Sig: TAKE 1/2 TABLET (2.5 MG TOTAL) BY MOUTH AT BEDTIME AS NEEDED FOR SLEEP     Not Delegated - Psychiatry:  Anxiolytics/Hypnotics Failed - 01/07/2024  4:02 PM      Failed - This refill cannot be delegated      Failed - Urine Drug Screen completed in last 360 days      Passed - Valid encounter within last 6 months    Recent Outpatient Visits           1 month ago Acute non-recurrent pansinusitis   [place] [facility] [place], Marzella Schlein, MD   3 months ago Weight gain   [facility] [place], Marzella Schlein, MD   7 months ago Acute recurrent pansinusitis   [place] [facility] [place], Marzella Schlein, MD   9 months ago Insomnia, unspecified type   [facility] [place], Marzella Schlein, MD   1 year ago Plantar fasciitis   [place] [facility] [place], Marzella Schlein, MD       Future Appointments             In 2 months Bacigalupo, Marzella Schlein, MD [facility]   In 4 months Terri Piedra, DO [facility]               Requested Prescriptions  Pending Prescriptions Disp Refills   zolpidem (AMBIEN) 5 MG tablet [Pharmacy Med Name: ZOLPIDEM TARTRATE 5 MG TABLET] 15 tablet 2    Sig: TAKE 1/2 TABLET (2.5 MG TOTAL) BY MOUTH AT BEDTIME AS NEEDED FOR SLEEP     Not Delegated - Psychiatry:  Anxiolytics/Hypnotics Failed - 01/07/2024  4:02 PM      Failed - This refill cannot be delegated      Failed - Urine Drug Screen completed in last 360 days      Passed - Valid  encounter within last 6 months    Recent Outpatient Visits           1 month ago Acute non-recurrent pansinusitis   [place] [facility] [place], Marzella Schlein, MD   3 months ago Weight gain   [facility] [place], Marzella Schlein, MD   7 months ago Acute recurrent pansinusitis   [facility] [place], Marzella Schlein, MD   9 months ago Insomnia, unspecified type   [facility] [place], Marzella Schlein, MD   1 year ago Plantar fasciitis   [place] [facility] [place], Marzella Schlein, MD       Future Appointments             In 2 months Bacigalupo, Marzella Schlein, MD [facility]   In 4 months Terri Piedra, DO [facility]

## 2024-02-08 ENCOUNTER — Other Ambulatory Visit: Payer: Self-pay | Admitting: Family Medicine

## 2024-02-08 DIAGNOSIS — Z1231 Encounter for screening mammogram for malignant neoplasm of breast: Secondary | ICD-10-CM

## 2024-02-29 ENCOUNTER — Encounter

## 2024-03-02 ENCOUNTER — Other Ambulatory Visit: Payer: Self-pay | Admitting: Family Medicine

## 2024-03-04 NOTE — Telephone Encounter (Signed)
 Requested Prescriptions  Pending Prescriptions Disp Refills   propranolol  (INDERAL ) 10 MG tablet [Pharmacy Med Name: PROPRANOLOL  10 MG TABLET] 90 tablet 0    Sig: TAKE 1 TABLET BY MOUTH EVERY DAY     Cardiovascular:  Beta Blockers Failed - 03/04/2024 10:20 AM      Failed - Last Heart Rate in normal range    Pulse Readings from Last 1 Encounters:  10/22/23 (!) 178         Failed - Valid encounter within last 6 months    Recent Outpatient Visits   None     Future Appointments             In 6 days Bacigalupo, Stan Eans, MD [facility]   In 2 months Dellar Fenton, DO [facility]            Passed - Last BP in normal range    BP Readings from Last 1 Encounters:  10/22/23 110/68          citalopram  (CELEXA ) 20 MG tablet [Pharmacy Med Name: CITALOPRAM  HBR 20 MG TABLET] 90 tablet 0    Sig: TAKE 1 TABLET BY MOUTH EVERY DAY     Psychiatry:  Antidepressants - SSRI Failed - 03/04/2024 10:20 AM      Failed - Valid encounter within last 6 months    Recent Outpatient Visits   None     Future Appointments             In 6 days Bacigalupo, Stan Eans, MD [facility]   In 2 months Dellar Fenton, DO [facility]

## 2024-03-06 ENCOUNTER — Ambulatory Visit
Admission: RE | Admit: 2024-03-06 | Discharge: 2024-03-06 | Disposition: A | Discharge status: Home / Self Care | Source: Ambulatory Visit | Attending: Family Medicine | Admitting: Family Medicine

## 2024-03-06 DIAGNOSIS — Z1231 Encounter for screening mammogram for malignant neoplasm of breast: Secondary | ICD-10-CM | POA: Diagnosis not present

## 2024-03-10 ENCOUNTER — Encounter: Payer: Self-pay | Admitting: Family Medicine

## 2024-03-10 ENCOUNTER — Ambulatory Visit: Payer: Self-pay | Admitting: Family Medicine

## 2024-03-10 VITALS — BP 114/77 | HR 72 | Ht 67.0 in | Wt 231.0 lb

## 2024-03-10 DIAGNOSIS — E669 Obesity, unspecified: Secondary | ICD-10-CM

## 2024-03-10 DIAGNOSIS — Z0001 Encounter for general adult medical examination with abnormal findings: Secondary | ICD-10-CM

## 2024-03-10 DIAGNOSIS — Z0000 Encounter for general adult medical examination without abnormal findings: Secondary | ICD-10-CM

## 2024-03-10 DIAGNOSIS — Z1211 Encounter for screening for malignant neoplasm of colon: Secondary | ICD-10-CM

## 2024-03-10 DIAGNOSIS — E785 Hyperlipidemia, unspecified: Secondary | ICD-10-CM | POA: Diagnosis not present

## 2024-03-10 DIAGNOSIS — E559 Vitamin D deficiency, unspecified: Secondary | ICD-10-CM | POA: Diagnosis not present

## 2024-03-10 DIAGNOSIS — F411 Generalized anxiety disorder: Secondary | ICD-10-CM | POA: Diagnosis not present

## 2024-03-10 DIAGNOSIS — Z6836 Body mass index (BMI) 36.0-36.9, adult: Secondary | ICD-10-CM | POA: Diagnosis not present

## 2024-03-10 DIAGNOSIS — G4700 Insomnia, unspecified: Secondary | ICD-10-CM | POA: Diagnosis not present

## 2024-03-10 DIAGNOSIS — Z23 Encounter for immunization: Secondary | ICD-10-CM | POA: Diagnosis not present

## 2024-03-10 MED ORDER — ATORVASTATIN CALCIUM 40 MG PO TABS: 40.0000 mg | ORAL_TABLET | Freq: Every day | ORAL | 3 refills | Status: AC

## 2024-03-10 MED ORDER — NALTREXONE HCL 50 MG PO TABS: 25.0000 mg | ORAL_TABLET | Freq: Every day | ORAL | 3 refills | Status: DC

## 2024-03-10 MED ORDER — TRIMETHOPRIM 100 MG PO TABS: 100.0000 mg | ORAL_TABLET | ORAL | 3 refills | Status: AC | PRN

## 2024-03-10 MED ORDER — BUPROPION HCL ER (SR) 150 MG PO TB12: 150.0000 mg | ORAL_TABLET | Freq: Two times a day (BID) | ORAL | 3 refills | Status: DC

## 2024-03-10 MED ORDER — ZOLPIDEM TARTRATE 5 MG PO TABS: 2.5000 mg | ORAL_TABLET | Freq: Every evening | ORAL | 2 refills | Status: DC | PRN

## 2024-03-10 MED ORDER — PROPRANOLOL HCL 10 MG PO TABS: 10.0000 mg | ORAL_TABLET | Freq: Every day | ORAL | 3 refills | Status: AC

## 2024-03-10 MED ORDER — CITALOPRAM HYDROBROMIDE 20 MG PO TABS: 20.0000 mg | ORAL_TABLET | Freq: Every day | ORAL | 3 refills | Status: AC

## 2024-03-10 NOTE — Patient Instructions (Signed)
 Tdap (Tetanus, Diphtheria, Pertussis) Vaccine: What You Need to Know Many vaccine information statements are available in Spanish and other languages. See PromoAge.com.br. 1. Why get vaccinated? Tdap vaccine can prevent tetanus, diphtheria, and pertussis. Diphtheria and pertussis spread from person to person. Tetanus enters the body through cuts or wounds. TETANUS (T) causes painful stiffening of the muscles. Tetanus can lead to serious health problems, including being unable to open the mouth, having trouble swallowing and breathing, or death. DIPHTHERIA (D) can lead to difficulty breathing, heart failure, paralysis, or death. PERTUSSIS (aP), also known as "whooping cough," can cause uncontrollable, violent coughing that makes it hard to breathe, eat, or drink. Pertussis can be extremely serious especially in babies and young children, causing pneumonia, convulsions, brain damage, or death. In teens and adults, it can cause weight loss, loss of bladder control, passing out, and rib fractures from severe coughing. 2. Tdap vaccine Tdap is only for children 7 years and older, adolescents, and adults.  Adolescents should receive a single dose of Tdap, preferably at age 76 or 12 years. Pregnant people should get a dose of Tdap during every pregnancy, preferably during the early part of the third trimester, to help protect the newborn from pertussis. Infants are most at risk for severe, life-threatening complications from pertussis. Adults who have never received Tdap should get a dose of Tdap. Also, adults should receive a booster dose of either Tdap or Td (a different vaccine that protects against tetanus and diphtheria but not pertussis) every 10 years, or after 5 years in the case of a severe or dirty wound or burn. Tdap may be given at the same time as other vaccines. 3. Talk with your health care provider Tell your vaccine provider if the person getting the vaccine: Has had an allergic  reaction after a previous dose of any vaccine that protects against tetanus, diphtheria, or pertussis, or has any severe, life-threatening allergies Has had a coma, decreased level of consciousness, or prolonged seizures within 7 days after a previous dose of any pertussis vaccine (DTP, DTaP, or Tdap) Has seizures or another nervous system problem Has ever had Guillain-Barr Syndrome (also called "GBS") Has had severe pain or swelling after a previous dose of any vaccine that protects against tetanus or diphtheria In some cases, your health care provider may decide to postpone Tdap vaccination until a future visit. People with minor illnesses, such as a cold, may be vaccinated. People who are moderately or severely ill should usually wait until they recover before getting Tdap vaccine.  Your health care provider can give you more information. 4. Risks of a vaccine reaction Pain, redness, or swelling where the shot was given, mild fever, headache, feeling tired, and nausea, vomiting, diarrhea, or stomachache sometimes happen after Tdap vaccination. People sometimes faint after medical procedures, including vaccination. Tell your provider if you feel dizzy or have vision changes or ringing in the ears.  As with any medicine, there is a very remote chance of a vaccine causing a severe allergic reaction, other serious injury, or death. 5. What if there is a serious problem? An allergic reaction could occur after the vaccinated person leaves the clinic. If you see signs of a severe allergic reaction (hives, swelling of the face and throat, difficulty breathing, a fast heartbeat, dizziness, or weakness), call 9-1-1 and get the person to the nearest hospital. For other signs that concern you, call your health care provider.  Adverse reactions should be reported to the Vaccine Adverse Event Reporting  System (VAERS). Your health care provider will usually file this report, or you can do it yourself. Visit the  VAERS website at www.vaers.LAgents.no or call 437-731-6503. VAERS is only for reporting reactions, and VAERS staff members do not give medical advice. 6. The National Vaccine Injury Compensation Program The [employer] Vaccine Injury Compensation Program (VICP) is a federal program that was created to compensate people who may have been injured by certain vaccines. Claims regarding alleged injury or death due to vaccination have a time limit for filing, which may be as short as two years. Visit the VICP website at SpiritualWord.at or call (669) 837-1631 to learn about the program and about filing a claim. 7. How can I learn more? Ask your health care provider. Call your local or state health department. Visit the website of the Food and Drug Administration (FDA) for vaccine package inserts and additional information at FinderList.no. Contact the Centers for Disease Control and Prevention (CDC): Call (484) 483-2759 (1-800-CDC-INFO) or Visit CDC's website at PicCapture.uy. Source: CDC Vaccine Information Statement Tdap (Tetanus, Diphtheria, Pertussis) Vaccine (06/11/2020) This same material is available at FootballExhibition.com.br for no charge. This information is not intended to replace advice given to you by your health care provider. Make sure you discuss any questions you have with your health care provider. Document Revised: 02/07/2023 Document Reviewed: 12/08/2022 Elsevier Patient Education  2024 [employer].

## 2024-03-10 NOTE — Progress Notes (Signed)
 Complete physical exam   Patient: Melissa Ho   DOB: February 17, 1976   48 y.o. Female  MRN: [id number] Visit Date: 03/10/2024  Today's healthcare provider: Aden Agreste, MD   Chief Complaint  Patient presents with   Annual Exam    Diet -  General, healthy Exercise - walking daily for 30 minutes Feeling - well Sleeping - poorly Concerns - none    Subjective    Melissa Ho is a 48 y.o. female who presents today for a complete physical exam.   Discussed the use of AI scribe software for clinical note transcription with the patient, who gave verbal consent to proceed.  History of Present Illness   Melissa Ho "Nana Avers" is a 48 year old female who presents for an annual physical.  She takes atorvastatin  40 mg daily for hyperlipidemia, Celexa  20 mg daily for anxiety, propranolol  10 mg daily, and Ambien  2.5 mg as needed for sleep. She is on a high dose vitamin D  supplement due to deficiency.  She experiences difficulty managing her weight, gaining approximately 20 pounds per year. This affects her ability to wear her wedding ring and requires new clothing purchases. She attributes weight issues to perimenopausal hormonal changes, with mood swings and emotional variability. Despite dietary efforts, she continues to gain weight.  She has a history of vitamin D  deficiency and previously took a high dose weekly supplement. She noticed improved energy levels with supplementation but reports a decline in energy as she is not currently taking any vitamins.  She has a mole on her back with no changes since the last evaluation and an upcoming dermatology appointment in July.  There is no family history of colon cancer.        Last depression screening scores    09/10/2023    9:34 AM 03/15/2023    8:15 AM 12/11/2022    2:45 PM  PHQ 2/9 Scores  PHQ - 2 Score 0 0 0  PHQ- 9 Score 7 3 4    Last fall risk screening    09/10/2023    9:34 AM  Fall Risk   Falls in the past year? 0   Number falls in past yr: 0  Injury with Fall? 0  Risk for fall due to : No Fall Risks        Medications: Outpatient Medications Prior to Visit  Medication Sig   fluticasone (FLONASE) 50 MCG/ACT nasal spray Place 1 spray into both nostrils as needed for allergies or rhinitis.   meloxicam  (MOBIC ) 15 MG tablet TAKE 1 TABLET (15 MG TOTAL) BY MOUTH DAILY.   triamcinolone  ointment (KENALOG ) 0.5 % Apply 1 application topically 2 (two) times daily.   [DISCONTINUED] atorvastatin  (LIPITOR) 40 MG tablet TAKE 1 TABLET BY MOUTH EVERY DAY   [DISCONTINUED] azithromycin  (ZITHROMAX ) 250 MG tablet Take 500mg  PO daily x1d and then 250mg  daily x4 days   [DISCONTINUED] citalopram  (CELEXA ) 20 MG tablet TAKE 1 TABLET BY MOUTH EVERY DAY   [DISCONTINUED] propranolol  (INDERAL ) 10 MG tablet TAKE 1 TABLET BY MOUTH EVERY DAY   [DISCONTINUED] zolpidem  (AMBIEN ) 5 MG tablet TAKE 1/2 TABLET (2.5 MG TOTAL) BY MOUTH AT BEDTIME AS NEEDED FOR SLEEP   [DISCONTINUED] trimethoprim  (TRIMPEX ) 100 MG tablet TAKE 1 TABLET (100 MG TOTAL) BY MOUTH AS NEEDED (AFTER INTERCOURSE). (Patient not taking: Reported on 03/10/2024)   [DISCONTINUED] Vitamin D , Ergocalciferol , (DRISDOL ) 1.25 MG (50000 UNIT) CAPS capsule Take 1 capsule (50,000 Units total) by mouth every 7 (seven) days. (Patient not taking: Reported  on 03/10/2024)   No facility-administered medications prior to visit.    Review of Systems    Objective    BP 114/77 (BP Location: Left Arm, Patient Position: Sitting, Cuff Size: Normal)   Pulse 72   Ht 5\' 7"  (1.702 m)   Wt 231 lb (104.8 kg)   LMP 02/21/2024   BMI 36.18 kg/m    Physical Exam Vitals reviewed.  Constitutional:      General: She is not in acute distress.    Appearance: Normal appearance. She is well-developed. She is not diaphoretic.  HENT:     Head: Normocephalic and atraumatic.     Right Ear: Tympanic membrane, ear canal and external ear normal.     Left Ear: Tympanic membrane, ear canal and external  ear normal.     Nose: Nose normal.     Mouth/Throat:     Mouth: Mucous membranes are moist.     Pharynx: Oropharynx is clear. No oropharyngeal exudate.  Eyes:     General: No scleral icterus.    Conjunctiva/sclera: Conjunctivae normal.     Pupils: Pupils are equal, round, and reactive to light.  Neck:     Thyroid: No thyromegaly.  Cardiovascular:     Rate and Rhythm: Normal rate and regular rhythm.     Heart sounds: Normal heart sounds. No murmur heard. Pulmonary:     Effort: Pulmonary effort is normal. No respiratory distress.     Breath sounds: Normal breath sounds. No wheezing or rales.  Abdominal:     General: There is no distension.     Palpations: Abdomen is soft.     Tenderness: There is no abdominal tenderness.  Musculoskeletal:        General: No deformity.     Cervical back: Neck supple.     Right lower leg: No edema.     Left lower leg: No edema.  Lymphadenopathy:     Cervical: No cervical adenopathy.  Skin:    General: Skin is warm and dry.     Findings: No rash.  Neurological:     Mental Status: She is alert and oriented to person, place, and time. Mental status is at baseline.     Gait: Gait normal.  Psychiatric:        Mood and Affect: Mood normal.        Behavior: Behavior normal.        Thought Content: Thought content normal.      No results found for any visits on 03/10/24.  Assessment & Plan    Routine Health Maintenance and Physical Exam  Exercise Activities and Dietary recommendations  Goals   None     Immunization History  Administered Date(s) Administered   Influenza, Seasonal, Injecte, Preservative Fre 08/12/2023   Influenza,inj,Quad PF,6+ Mos 10/06/2019, 07/05/2020, 07/12/2021, 09/04/2022   PFIZER(Purple Top)SARS-COV-2 Vaccination 01/13/2020, 02/03/2020, 08/17/2020   PPD Test 07/09/2019, 06/23/2020   Pfizer Covid-19 Vaccine Bivalent Booster 55yrs & up 09/25/2021   Pfizer(Comirnaty)Fall Seasonal Vaccine 12 years and older 09/04/2022,  08/12/2023   Tdap 03/10/2024    Health Maintenance  Topic Date Due   Colonoscopy  Never done   INFLUENZA VACCINE  06/06/2024   Cervical Cancer Screening (HPV/Pap Cotest)  07/12/2026   DTaP/Tdap/Td (2 - Td or Tdap) 03/10/2034   COVID-19 Vaccine  Completed   Hepatitis C Screening  Completed   HIV Screening  Completed   HPV VACCINES  Aged Out   Meningococcal B Vaccine  Aged Out    Discussed  health benefits of physical activity, and encouraged her to engage in regular exercise appropriate for her age and condition.  Problem List Items Addressed This Visit       Other   Insomnia   GAD (generalized anxiety disorder)   Relevant Medications   buPROPion (WELLBUTRIN SR) 150 MG 12 hr tablet   citalopram  (CELEXA ) 20 MG tablet   Hyperlipidemia   Relevant Medications   atorvastatin  (LIPITOR) 40 MG tablet   propranolol  (INDERAL ) 10 MG tablet   Other Relevant Orders   Comprehensive metabolic panel with GFR   Lipid panel   Obesity   Other Visit Diagnoses       Encounter for annual physical exam    -  Primary   Relevant Orders   Comprehensive metabolic panel with GFR   Lipid panel   VITAMIN D  25 Hydroxy (Vit-D Deficiency, Fractures)     Colon cancer screening       Relevant Orders   Ambulatory referral to Gastroenterology     Avitaminosis D       Relevant Orders   VITAMIN D  25 Hydroxy (Vit-D Deficiency, Fractures)     Immunization due       Relevant Orders   Tdap vaccine greater than or equal to 7yo IM (Completed)     Need for diphtheria-tetanus-pertussis (Tdap) vaccine       Relevant Orders   Tdap vaccine greater than or equal to 7yo IM (Completed)           Wellness Visit Annual physical examination conducted. Discussed general health maintenance, including tetanus vaccination and colon cancer screening. She opted for colonoscopy as the preferred method for colon cancer screening due to its comprehensive nature, including biopsy and treatment capabilities. - Administer  tetanus shot - Ref[facility] for colonoscopy at [facility] - Order labs including vitamin D , kidney and liver function, and cholesterol - Review mammogram results when available  Obesity Struggling with weight management, likely exacerbated by perimenopausal changes. Discussed weightlifting as a beneficial exercise for metabolism. Explored pharmacological options for weight loss, including the use of Wellbutrin and naltrexone as a cost-effective alternative to Contrave. Wellbutrin and naltrexone combination may help curb appetite and cravings, with the potential for discontinuation after achieving weight loss goals. - Prescribe Wellbutrin SR 150mg  twice daily (morning and afternoon) - Prescribe naltrexone 25 mg once daily - Follow up in 3 months to assess weight management and medication efficacy  Perimenopausal symptoms Experiencing mood swings and weight changes associated with perimenopause. Discussed the benefits of weightlifting and potential pharmacological interventions for weight management.  Hyperlipidemia Managed with atorvastatin  40 mg daily. recheck lipids today.  Vitamin D  deficiency Previously on high-dose vitamin D  supplementation. Energy levels improved but may be declining again. Plan to reassess vitamin D  levels and adjust supplementation accordingly. Discussed the importance of transitioning to daily vitamin D3 supplementation after high-dose therapy. - Order vitamin D  level - Consider daily vitamin D3 supplementation (1000-2000 IU) based on lab results  Generalized Anxiety Disorder Managed with Celexa  20 mg daily and propranolol  10 mg daily.  Insomnia Managed with Ambien  2.5 mg as needed, primarily used during weekdays.        Return in about 3 months (around 06/10/2024) for weight f/u, virtual ok.     Aden Agreste, MD  [facility] [phone] (phone) [phone] (fax)  [facility]

## 2024-03-11 ENCOUNTER — Encounter: Payer: Self-pay | Admitting: Family Medicine

## 2024-03-11 LAB — COMPREHENSIVE METABOLIC PANEL WITH GFR
ALT: 18 IU/L (ref 0–32)
AST: 16 IU/L (ref 0–40)
Albumin: 4.7 g/dL (ref 3.9–4.9)
Alkaline Phosphatase: 102 IU/L (ref 44–121)
BUN/Creatinine Ratio: 17 (ref 9–23)
BUN: 15 mg/dL (ref 6–24)
Bilirubin Total: 0.5 mg/dL (ref 0.0–1.2)
CO2: 20 mmol/L (ref 20–29)
Calcium: 9.4 mg/dL (ref 8.7–10.2)
Chloride: 106 mmol/L (ref 96–106)
Creatinine, Ser: 0.87 mg/dL (ref 0.57–1.00)
Globulin, Total: 2.3 g/dL (ref 1.5–4.5)
Glucose: 92 mg/dL (ref 70–99)
Potassium: 4.8 mmol/L (ref 3.5–5.2)
Sodium: 140 mmol/L (ref 134–144)
Total Protein: 7 g/dL (ref 6.0–8.5)
eGFR: 83 mL/min/{1.73_m2} (ref >59)

## 2024-03-11 LAB — VITAMIN D 25 HYDROXY (VIT D DEFICIENCY, FRACTURES): Vit D, 25-Hydroxy: 26.3 ng/mL — ABNORMAL LOW (ref 30.0–100.0)

## 2024-03-11 LAB — LIPID PANEL
Chol/HDL Ratio: 3.1 ratio (ref 0.0–4.4)
Cholesterol, Total: 147 mg/dL (ref 100–199)
HDL: 48 mg/dL (ref >39)
LDL Chol Calc (NIH): 77 mg/dL (ref 0–99)
Triglycerides: 123 mg/dL (ref 0–149)
VLDL Cholesterol Cal: 22 mg/dL (ref 5–40)

## 2024-03-12 ENCOUNTER — Encounter: Payer: Self-pay | Admitting: Family Medicine

## 2024-03-20 ENCOUNTER — Telehealth: Payer: Self-pay | Admitting: Family Medicine

## 2024-03-20 NOTE — Telephone Encounter (Signed)
 [facility] faxed refill request for the following medications:   triamcinolone  ointment (KENALOG ) 0.5 %    Please advise.

## 2024-03-21 ENCOUNTER — Other Ambulatory Visit: Payer: Self-pay

## 2024-03-21 MED ORDER — TRIAMCINOLONE ACETONIDE 0.5 % EX OINT: 1.0000 | TOPICAL_OINTMENT | Freq: Two times a day (BID) | CUTANEOUS | 5 refills | Status: AC

## 2024-03-24 ENCOUNTER — Telehealth: Payer: Self-pay | Admitting: Family Medicine

## 2024-03-24 ENCOUNTER — Other Ambulatory Visit: Payer: Self-pay

## 2024-03-24 NOTE — Telephone Encounter (Signed)
Converted to refill req

## 2024-03-24 NOTE — Telephone Encounter (Signed)
 [facility] is requesting refill triamcinolone  ointment (KENALOG ) 0.5 %  Please advise

## 2024-03-28 MED ORDER — TOPIRAMATE 50 MG PO TABS: 50.0000 mg | ORAL_TABLET | Freq: Two times a day (BID) | ORAL | 3 refills | Status: DC

## 2024-03-28 NOTE — Addendum Note (Signed)
 Addended by: [place] on: 03/28/2024 02:31 PM   Modules accepted: Orders

## 2024-04-26 ENCOUNTER — Other Ambulatory Visit: Payer: Self-pay | Admitting: Podiatry

## 2024-05-13 ENCOUNTER — Ambulatory Visit: Payer: BC Managed Care – PPO | Admitting: Dermatology

## 2024-05-13 ENCOUNTER — Encounter: Payer: Self-pay | Admitting: Dermatology

## 2024-05-13 VITALS — BP 108/70 | HR 68

## 2024-05-13 DIAGNOSIS — L578 Other skin changes due to chronic exposure to nonionizing radiation: Secondary | ICD-10-CM

## 2024-05-13 DIAGNOSIS — D2262 Melanocytic nevi of left upper limb, including shoulder: Secondary | ICD-10-CM | POA: Diagnosis not present

## 2024-05-13 DIAGNOSIS — D229 Melanocytic nevi, unspecified: Secondary | ICD-10-CM

## 2024-05-13 DIAGNOSIS — D492 Neoplasm of unspecified behavior of bone, soft tissue, and skin: Secondary | ICD-10-CM | POA: Diagnosis not present

## 2024-05-13 DIAGNOSIS — Z1283 Encounter for screening for malignant neoplasm of skin: Secondary | ICD-10-CM

## 2024-05-13 DIAGNOSIS — L989 Disorder of the skin and subcutaneous tissue, unspecified: Secondary | ICD-10-CM | POA: Diagnosis not present

## 2024-05-13 DIAGNOSIS — D1801 Hemangioma of skin and subcutaneous tissue: Secondary | ICD-10-CM

## 2024-05-13 DIAGNOSIS — D225 Melanocytic nevi of trunk: Secondary | ICD-10-CM | POA: Diagnosis not present

## 2024-05-13 DIAGNOSIS — L814 Other melanin hyperpigmentation: Secondary | ICD-10-CM

## 2024-05-13 DIAGNOSIS — D485 Neoplasm of uncertain behavior of skin: Secondary | ICD-10-CM

## 2024-05-13 DIAGNOSIS — L821 Other seborrheic keratosis: Secondary | ICD-10-CM

## 2024-05-13 DIAGNOSIS — W908XXA Exposure to other nonionizing radiation, initial encounter: Secondary | ICD-10-CM

## 2024-05-13 NOTE — Patient Instructions (Addendum)
Patient Handout: Wound Care for Skin Biopsy Site  Taking Care of Your Skin Biopsy Site  Proper care of the biopsy site is essential for promoting healing and minimizing scarring. This handout provides instructions on how to care for your biopsy site to ensure optimal recovery.  1. Cleaning the Wound:  Clean the biopsy site daily with gentle soap and water. Gently pat the area dry with a clean, soft towel. Avoid harsh scrubbing or rubbing the area, as this can irritate the skin and delay healing.  2. Applying Aquaphor and Bandage:  After cleaning the wound, apply a thin layer of Aquaphor ointment to the biopsy site. Cover the area with a sterile bandage to protect it from dirt, bacteria, and friction. Change the bandage daily or as needed if it becomes soiled or wet.  3. Continued Care for One Week:  Repeat the cleaning, Aquaphor application, and bandaging process daily for one week following the biopsy procedure. Keeping the wound clean and moist during this initial healing period will help prevent infection and promote optimal healing.  4. Massaging Aquaphor into the Area:  ---After one week, discontinue the use of bandages but continue to apply Aquaphor to the biopsy site. ----Gently massage the Aquaphor into the area using circular motions. ---Massaging the skin helps to promote circulation and prevent the formation of scar tissue.   Additional Tips:  Avoid exposing the biopsy site to direct sunlight during the healing process, as this can cause hyperpigmentation or worsen scarring. If you experience any signs of infection, such as increased redness, swelling, warmth, or drainage from the wound, contact your healthcare provider immediately. Follow any additional instructions provided by your healthcare provider for caring for the biopsy site and managing any discomfort. Conclusion:  Taking proper care of your skin biopsy site is crucial for ensuring optimal healing and  minimizing scarring. By following these instructions for cleaning, applying Aquaphor, and massaging the area, you can promote a smooth and successful recovery. If you have any questions or concerns about caring for your biopsy site, don't hesitate to contact your healthcare provider for guidance.  Skin Education :   I counseled the patient regarding the following: Sun screen (SPF 30 or greater) should be applied during peak UV exposure (between 10am and 2pm) and reapplied after exercise or swimming.  The ABCDEs of melanoma were reviewed with the patient, and the importance of monthly self-examination of moles was emphasized. Should any moles change in shape or color, or itch, bleed or burn, pt will contact our office for evaluation sooner then their interval appointment.  Plan: Sunscreen Recommendations I recommended a broad spectrum sunscreen with a SPF of 30 or higher. I explained that SPF 30 sunscreens block approximately 97 percent of the sun's harmful rays. Sunscreens should be applied at least 15 minutes prior to expected sun exposure and then every 2 hours after that as long as sun exposure continues. If swimming or exercising sunscreen should be reapplied every 45 minutes to an hour after getting wet or sweating. One ounce, or the equivalent of a shot glass full of sunscreen, is adequate to protect the skin not covered by a bathing suit. I also recommended a lip balm with a sunscreen as well. Sun protective clothing can be used in lieu of sunscreen but must be worn the entire time you are exposed to the sun's rays.  Important Information  Due to recent changes in healthcare laws, you may see results of your pathology and/or laboratory studies on MyChart  before the doctors have had a chance to review them. We understand that in some cases there may be results that are confusing or concerning to you. Please understand that not all results are received at the same time and often the doctors may need  to interpret multiple results in order to provide you with the best plan of care or course of treatment. Therefore, we ask that you please give [place] 2 business days to thoroughly review all your results before contacting the office for clarification. Should we see a critical lab result, you will be contacted sooner.   If You Need Anything After Your Visit  If you have any questions or concerns for your doctor, please call our main line at [phone] If no one answers, please leave a voicemail as directed and we will return your call as soon as possible. Messages left after 4 pm will be answered the following business day.   You may also send [place] a message via MyChart. We typically respond to MyChart messages within 1-2 business days.  For prescription refills, please ask your pharmacy to contact our office. Our fax number is [phone].  If you have an urgent issue when the clinic is closed that cannot wait until the next business day, you can page your doctor at the number below.    Please note that while we do our best to be available for urgent issues outside of office hours, we are not available 24/7.   If you have an urgent issue and are unable to reach [place], you may choose to seek medical care at your doctor's office, retail clinic, urgent care center, or emergency room.  If you have a medical emergency, please immediately call 911 or go to the emergency department. In the event of inclement weather, please call our main line at [phone] for an update on the status of any delays or closures.  Dermatology Medication Tips: Please keep the boxes that topical medications come in in order to help keep track of the instructions about where and how to use these. Pharmacies typically print the medication instructions only on the boxes and not directly on the medication tubes.   If your medication is too expensive, please contact our office at [phone] or send [place] a message through MyChart.    We are unable to tell what your co-pay for medications will be in advance as this is different depending on your insurance coverage. However, we may be able to find a substitute medication at lower cost or fill out paperwork to get insurance to cover a needed medication.   If a prior authorization is required to get your medication covered by your insurance company, please allow [place] 1-2 business days to complete this process.  Drug prices often vary depending on where the prescription is filled and some pharmacies may offer cheaper prices.  The website www.goodrx.com contains coupons for medications through different pharmacies. The prices here do not account for what the cost may be with help from insurance (it may be cheaper with your insurance), but the website can give you the price if you did not use any insurance.  - You can print the associated coupon and take it with your prescription to the pharmacy.  - You may also stop by our office during regular business hours and pick up a GoodRx coupon card.  - If you need your prescription sent electronically to a different pharmacy, notify our office through [facility] or by phone at  [phone]     

## 2024-05-13 NOTE — Progress Notes (Signed)
 New Patient Visit   Subjective  Melissa Ho is a 48 y.o. female who presents for the following: Skin Cancer Screening and Full Body Skin Exam   No hx of skin cancer or family hx.  Patient has had a lot of sun exposure over her lifetime.   The patient presents for Total-Body Skin Exam (TBSE) for skin cancer screening and mole check. The patient has spots, moles and lesions to be evaluated, some may be new or changing and the patient may have concern these could be cancer.    The following portions of the chart were reviewed this encounter and updated as appropriate: medications, allergies, medical history  Review of Systems:  No other skin or systemic complaints except as noted in HPI or Assessment and Plan.  Objective  Well appearing patient in no apparent distress; mood and affect are within normal limits.  A full examination was performed including scalp, head, eyes, ears, nose, lips, neck, chest, axillae, abdomen, back, buttocks, bilateral upper extremities, bilateral lower extremities, hands, feet, fingers, toes, fingernails, and toenails. All findings within normal limits unless otherwise noted below.   Relevant physical exam findings are noted in the Assessment and Plan.  Left Shoulder - Anterior 6mm dark brown papule   Mid Back 1 cm brown papule  Left Abdomen (side) - Upper 5mm med brown macule   Left Flank 1.3 cm brown papule   Assessment & Plan   SKIN CANCER SCREENING PERFORMED TODAY.  ACTINIC DAMAGE - Chronic condition, secondary to cumulative UV/sun exposure - diffuse scaly erythematous macules with underlying dyspigmentation - Recommend daily broad spectrum sunscreen SPF 30+ to sun-exposed areas, reapply every 2 hours as needed.  - Staying in the shade or wearing long sleeves, sun glasses (UVA+UVB protection) and wide brim hats (4-inch brim around the entire circumference of the hat) are also recommended for sun protection.  - Call for new or changing  lesions.  LENTIGINES, SEBORRHEIC KERATOSES, HEMANGIOMAS - Benign normal skin lesions - Benign-appearing - Call for any changes  MELANOCYTIC NEVI - Tan-brown and/or pink-flesh-colored symmetric macules and papules - Benign appearing on exam today - Observation - Call clinic for new or changing moles - Recommend daily use of broad spectrum spf 30+ sunscreen to sun-exposed areas.   NEOPLASM OF UNCERTAIN BEHAVIOR OF SKIN (4) Left Shoulder - Anterior Epidermal / dermal shaving  Lesion diameter (cm):  0.6 Informed consent: discussed and consent obtained   Timeout: patient name, date of birth, surgical site, and procedure verified   Procedure prep:  Patient was prepped and draped in usual sterile fashion Prep type:  Isopropyl alcohol Anesthesia: the lesion was anesthetized in a standard fashion   Anesthetic:  1% lidocaine w/ epinephrine 1-100,000 buffered w/ 8.4% NaHCO3 Instrument used: flexible razor blade   Hemostasis achieved with: pressure, aluminum chloride and electrodesiccation   Outcome: patient tolerated procedure well   Post-procedure details: sterile dressing applied and wound care instructions given   Dressing type: bandage and petrolatum    Specimen 1 - Surgical pathology Differential Diagnosis: r/o DN  Check Margins: No Mid Back Epidermal / dermal shaving  Lesion diameter (cm):  1 Informed consent: discussed and consent obtained   Timeout: patient name, date of birth, surgical site, and procedure verified   Procedure prep:  Patient was prepped and draped in usual sterile fashion Prep type:  Isopropyl alcohol Anesthesia: the lesion was anesthetized in a standard fashion   Anesthetic:  1% lidocaine w/ epinephrine 1-100,000 buffered w/ 8.4% NaHCO3 Instrument  used: flexible razor blade   Hemostasis achieved with: pressure, aluminum chloride and electrodesiccation   Outcome: patient tolerated procedure well   Post-procedure details: sterile dressing applied and wound  care instructions given   Dressing type: bandage and petrolatum    Specimen 2 - Surgical pathology Differential Diagnosis: r/o DN   Check Margins: No Left Abdomen (side) - Upper Epidermal / dermal shaving  Lesion diameter (cm):  0.5 Informed consent: discussed and consent obtained   Timeout: patient name, date of birth, surgical site, and procedure verified   Procedure prep:  Patient was prepped and draped in usual sterile fashion Prep type:  Isopropyl alcohol Anesthesia: the lesion was anesthetized in a standard fashion   Anesthetic:  1% lidocaine w/ epinephrine 1-100,000 buffered w/ 8.4% NaHCO3 Instrument used: flexible razor blade   Hemostasis achieved with: pressure, aluminum chloride and electrodesiccation   Outcome: patient tolerated procedure well   Post-procedure details: sterile dressing applied and wound care instructions given   Dressing type: bandage and petrolatum    Specimen 3 - Surgical pathology Differential Diagnosis: r/o DN   Check Margins: No Left Flank Epidermal / dermal shaving  Lesion diameter (cm):  1.3 Informed consent: discussed and consent obtained   Timeout: patient name, date of birth, surgical site, and procedure verified   Procedure prep:  Patient was prepped and draped in usual sterile fashion Prep type:  Isopropyl alcohol Anesthesia: the lesion was anesthetized in a standard fashion   Anesthetic:  1% lidocaine w/ epinephrine 1-100,000 buffered w/ 8.4% NaHCO3 Instrument used: flexible razor blade   Hemostasis achieved with: pressure, aluminum chloride and electrodesiccation   Outcome: patient tolerated procedure well   Post-procedure details: sterile dressing applied and wound care instructions given   Dressing type: bandage and petrolatum    Specimen 4 - Surgical pathology Differential Diagnosis: r/o DN   Check Margins: No  Return in about 1 year (around 05/13/2025) for TBSC.  IBerwyn Lesches, Surg Tech III, am acting as scribe for  [employer], DO.   Documentation: I have reviewed the above documentation for accuracy and completeness, and I agree with the above.  Delon Lenis, DO

## 2024-05-14 LAB — SURGICAL PATHOLOGY

## 2024-05-19 ENCOUNTER — Ambulatory Visit: Payer: Self-pay | Admitting: Dermatology

## 2024-05-20 ENCOUNTER — Encounter: Payer: Self-pay | Admitting: Family Medicine

## 2024-05-28 ENCOUNTER — Ambulatory Visit: Admission: RE | Admit: 2024-05-28 | Source: Home / Self Care | Admitting: Gastroenterology

## 2024-05-28 SURGERY — COLONOSCOPY
Anesthesia: General

## 2024-05-29 ENCOUNTER — Other Ambulatory Visit: Payer: Self-pay | Admitting: Family Medicine

## 2024-06-10 ENCOUNTER — Encounter: Payer: Self-pay | Admitting: Family Medicine

## 2024-06-10 ENCOUNTER — Telehealth (INDEPENDENT_AMBULATORY_CARE_PROVIDER_SITE_OTHER): Admitting: Family Medicine

## 2024-06-10 VITALS — Wt 213.0 lb

## 2024-06-10 DIAGNOSIS — E66811 Obesity, class 1: Secondary | ICD-10-CM | POA: Diagnosis not present

## 2024-06-10 DIAGNOSIS — Z6834 Body mass index (BMI) 34.0-34.9, adult: Secondary | ICD-10-CM

## 2024-06-10 DIAGNOSIS — K219 Gastro-esophageal reflux disease without esophagitis: Secondary | ICD-10-CM

## 2024-06-10 DIAGNOSIS — Z1211 Encounter for screening for malignant neoplasm of colon: Secondary | ICD-10-CM

## 2024-06-10 MED ORDER — TOPIRAMATE 100 MG PO TABS: 100.0000 mg | ORAL_TABLET | Freq: Two times a day (BID) | ORAL | 2 refills | Status: DC

## 2024-06-10 MED ORDER — OMEPRAZOLE 40 MG PO CPDR: 40.0000 mg | DELAYED_RELEASE_CAPSULE | Freq: Every day | ORAL | 3 refills | Status: DC

## 2024-06-10 NOTE — Assessment & Plan Note (Signed)
 Reports a 3-week history of upper abdominal burning and nausea, suggestive of GERD. Symptoms worsen when lying down and are relieved by Tums. Differential includes gastritis, peptic ulcer disease, and esophagitis. She has ceased NSAID use, including meloxicam , to prevent exacerbation of symptoms. - Prescribe omeprazole  40 mg daily for one month - Advise on lifestyle modifications, including elevating the head of the bed and avoiding spicy foods - Instruct to continue omeprazole  at a lower dose if symptoms improve after one month - Plan for potential upper endoscopy if symptoms do not improve

## 2024-06-10 NOTE — Assessment & Plan Note (Signed)
 Weight management is ongoing with Wellbutrin  SR 150 mg twice daily and Topamax  50 mg twice daily. She has made lifestyle changes, including cessation of alcohol and reduction in snacking, contributing to weight loss. Weight has plateaued at 213 lbs. She is tolerating Topamax  well and is interested in increasing the dose to enhance weight loss. - Increase Topamax  to 100 mg twice daily - Ensure prescription is sent with the correct quantity to avoid frequent pharmacy visits

## 2024-06-10 NOTE — Progress Notes (Signed)
 MyChart Video Visit    Virtual Visit via Video Note   This format is felt to be most appropriate for this patient at this time. Physical exam was limited by quality of the video and audio technology used for the visit.    Patient location: home Provider location: [facility] Persons involved in the visit: patient, provider  I discussed the limitations of evaluation and management by telemedicine and the availability of in person appointments. The patient expressed understanding and agreed to proceed.  Patient: Melissa Ho   DOB: 30-Apr-1976   48 y.o. Female  MRN: [id number] Visit Date: 06/10/2024  Today's healthcare provider: Jon Eva, MD   No chief complaint on file.  Subjective    HPI   Discussed the use of AI scribe software for clinical note transcription with the patient, who gave verbal consent to proceed.  History of Present Illness   Melissa Ho is a 48 year old female who presents with weight management concerns and recent stomach issues.  She follows a weight management regimen with Wellbutrin  SR 150 mg twice daily and naltrexone  25 mg once daily. Her weight is 213 pounds, with gradual loss but a recent plateau. She has quit alcohol and reduced snacking. She initially tried Contrave but switched to Topamax  50 mg twice daily due to adverse effects.  For the past three weeks, she experiences constant burning nausea in the upper stomach radiating into the esophagus, worsening when lying down. She consumes bland foods and avoids spicy foods. Eating sometimes temporarily alleviates discomfort, but symptoms return post-meal. Tums provide some relief. She has no bowel movement issues or alarming symptoms like blood in stool or vomiting blood. She stopped meloxicam  to avoid exacerbating stomach issues.       Review of Systems      Objective    Wt 213 lb (96.6 kg)   BMI 33.36 kg/m       Physical Exam Constitutional:       General: She is not in acute distress.    Appearance: Normal appearance.  HENT:     Head: Normocephalic.  Pulmonary:     Effort: Pulmonary effort is normal. No respiratory distress.  Neurological:     Mental Status: She is alert and oriented to person, place, and time. Mental status is at baseline.        Assessment & Plan     Problem List Items Addressed This Visit       Digestive   Gastroesophageal reflux disease   Reports a 3-week history of upper abdominal burning and nausea, suggestive of GERD. Symptoms worsen when lying down and are relieved by Tums. Differential includes gastritis, peptic ulcer disease, and esophagitis. She has ceased NSAID use, including meloxicam , to prevent exacerbation of symptoms. - Prescribe omeprazole  40 mg daily for one month - Advise on lifestyle modifications, including elevating the head of the bed and avoiding spicy foods - Instruct to continue omeprazole  at a lower dose if symptoms improve after one month - Plan for potential upper endoscopy if symptoms do not improve      Relevant Medications   omeprazole  (PRILOSEC) 40 MG capsule     Other   Obesity - Primary   Weight management is ongoing with Wellbutrin  SR 150 mg twice daily and Topamax  50 mg twice daily. She has made lifestyle changes, including cessation of alcohol and reduction in snacking, contributing to weight loss. Weight has plateaued at 213 lbs. She is tolerating Topamax  well and  is interested in increasing the dose to enhance weight loss. - Increase Topamax  to 100 mg twice daily - Ensure prescription is sent with the correct quantity to avoid frequent pharmacy visits      Other Visit Diagnoses       Colon cancer screening               Colonoscopy scheduling Colonoscopy was cancelled due to insurance coverage issues. She is asymptomatic and has no family history of colon issues, so she is comfortable waiting six months to resolve insurance coverage before  rescheduling. - Re-evaluate colonoscopy scheduling in six months       Meds ordered this encounter  Medications   topiramate  (TOPAMAX ) 100 MG tablet    Sig: Take 1 tablet (100 mg total) by mouth 2 (two) times daily.    Dispense:  60 tablet    Refill:  2   omeprazole  (PRILOSEC) 40 MG capsule    Sig: Take 1 capsule (40 mg total) by mouth daily.    Dispense:  30 capsule    Refill:  3     Return in about 3 months (around 09/10/2024) for chronic disease f/u, virtual ok.     I discussed the assessment and treatment plan with the patient. The patient was provided an opportunity to ask questions and all were answered. The patient agreed with the plan and demonstrated an understanding of the instructions.   The patient was advised to call back or seek an in-person evaluation if the symptoms worsen or if the condition fails to improve as anticipated.   Jon Eva, MD [facility] [phone] (phone) [phone] (fax)  [facility]

## 2024-07-11 ENCOUNTER — Encounter: Payer: Self-pay | Admitting: Family Medicine

## 2024-07-11 DIAGNOSIS — K219 Gastro-esophageal reflux disease without esophagitis: Secondary | ICD-10-CM

## 2024-07-16 ENCOUNTER — Other Ambulatory Visit: Payer: Self-pay | Admitting: Family Medicine

## 2024-08-07 ENCOUNTER — Encounter: Payer: Self-pay | Admitting: Family Medicine

## 2024-09-05 ENCOUNTER — Other Ambulatory Visit: Payer: Self-pay | Admitting: Family Medicine

## 2024-09-06 NOTE — Telephone Encounter (Signed)
 Requested Prescriptions  Pending Prescriptions Disp Refills   omeprazole  (PRILOSEC) 40 MG capsule [Pharmacy Med Name: OMEPRAZOLE  DR 40 MG CAPSULE] 90 capsule 0    Sig: TAKE 1 CAPSULE (40 MG TOTAL) BY MOUTH DAILY.     Gastroenterology: Proton Pump Inhibitors Passed - 09/06/2024  8:56 AM      Passed - Valid encounter within last 12 months    Recent Outpatient Visits           2 months ago Class 1 obesity without serious comorbidity with body mass index (BMI) of 34.0 to 34.9 in adult, unspecified obesity type   [facility] [place], Jon HERO, MD   6 months ago Encounter for annual physical exam   [facility], Jon HERO, MD

## 2024-09-09 ENCOUNTER — Telehealth: Admitting: Family Medicine

## 2024-09-09 ENCOUNTER — Encounter: Payer: Self-pay | Admitting: Family Medicine

## 2024-09-09 DIAGNOSIS — K219 Gastro-esophageal reflux disease without esophagitis: Secondary | ICD-10-CM

## 2024-09-09 DIAGNOSIS — E66811 Obesity, class 1: Secondary | ICD-10-CM | POA: Diagnosis not present

## 2024-09-09 DIAGNOSIS — Z6834 Body mass index (BMI) 34.0-34.9, adult: Secondary | ICD-10-CM

## 2024-09-09 NOTE — Progress Notes (Signed)
 MyChart Video Visit    Virtual Visit via Video Note   This format is felt to be most appropriate for this patient at this time. Physical exam was limited by quality of the video and audio technology used for the visit.    Patient location: home Provider location: [facility] Persons involved in the visit: patient, provider  I discussed the limitations of evaluation and management by telemedicine and the availability of in person appointments. The patient expressed understanding and agreed to proceed.  Patient: Melissa Ho   DOB: 1976/03/20   48 y.o. Female  MRN: [id number] Visit Date: 09/09/2024  Today's healthcare provider: Jon Eva, MD   No chief complaint on file.  Subjective    HPI   Discussed the use of AI scribe software for clinical note transcription with the patient, who gave verbal consent to proceed.  History of Present Illness   Melissa Ho is a 48 year old female who presents for follow-up on GERD and weight loss management.  GERD symptoms have improved with dietary changes and omeprazole  40 mg daily. She avoids red meat and fried foods, and consumes green and red peppers and cheese in moderation. Severe symptoms occurred after eating an egg roll, with a burning sensation in her throat and a feeling of a lump in her esophagus.  She previously used generic Qulipta for weight loss but discontinued it due to nausea and lack of appetite, which may have worsened her GERD. Her weight has stabilized at 200 pounds, down from 230 pounds, and she is currently managing her weight through diet without medication.       Review of Systems      Objective    There were no vitals taken for this visit.     Physical Exam Constitutional:      General: She is not in acute distress.    Appearance: Normal appearance.  HENT:     Head: Normocephalic.  Pulmonary:     Effort: Pulmonary effort is normal. No respiratory distress.   Neurological:     Mental Status: She is alert and oriented to person, place, and time. Mental status is at baseline.        Assessment & Plan     Problem List Items Addressed This Visit       Digestive   Gastroesophageal reflux disease - Primary   GERD symptoms have improved with dietary modifications and omeprazole  40 mg daily. Symptoms are more manageable, though not completely resolved. No evidence of gallbladder involvement as symptoms are localized to the esophagus. No current need for upper endoscopy unless symptoms worsen, such as food impaction or regurgitation. - Continue omeprazole  40 mg daily. - Maintain dietary modifications to avoid GERD triggers. - Will consider upper endoscopy if symptoms worsen or if food impaction occurs.        Other   Obesity   Weight has stabilized at 200 lbs after discontinuation of weight loss medications due to adverse effects including nausea and lack of appetite. Current weight is stable despite cessation of medication, indicating a positive outcome. Decision to remain off weight loss medications until GERD symptoms are better managed. - Continue current dietary regimen. - Remain off weight loss medications until GERD symptoms are better managed.        No orders of the defined types were placed in this encounter.    Return in about 6 months (around 03/09/2025) for CPE.     I discussed the assessment and  treatment plan with the patient. The patient was provided an opportunity to ask questions and all were answered. The patient agreed with the plan and demonstrated an understanding of the instructions.   The patient was advised to call back or seek an in-person evaluation if the symptoms worsen or if the condition fails to improve as anticipated.   Jon Eva, MD [facility] [phone] (phone) [phone] (fax)  [facility]

## 2024-09-09 NOTE — Assessment & Plan Note (Signed)
 GERD symptoms have improved with dietary modifications and omeprazole  40 mg daily. Symptoms are more manageable, though not completely resolved. No evidence of gallbladder involvement as symptoms are localized to the esophagus. No current need for upper endoscopy unless symptoms worsen, such as food impaction or regurgitation. - Continue omeprazole  40 mg daily. - Maintain dietary modifications to avoid GERD triggers. - Will consider upper endoscopy if symptoms worsen or if food impaction occurs.

## 2024-09-09 NOTE — Assessment & Plan Note (Signed)
 Weight has stabilized at 200 lbs after discontinuation of weight loss medications due to adverse effects including nausea and lack of appetite. Current weight is stable despite cessation of medication, indicating a positive outcome. Decision to remain off weight loss medications until GERD symptoms are better managed. - Continue current dietary regimen. - Remain off weight loss medications until GERD symptoms are better managed.

## 2024-09-23 ENCOUNTER — Encounter: Payer: Self-pay | Admitting: Internal Medicine

## 2024-10-16 ENCOUNTER — Other Ambulatory Visit: Payer: Self-pay | Admitting: Family Medicine

## 2024-12-04 ENCOUNTER — Other Ambulatory Visit: Payer: Self-pay | Admitting: Family Medicine

## 2025-01-01 ENCOUNTER — Ambulatory Visit: Admitting: Family Medicine

## 2025-02-25 ENCOUNTER — Ambulatory Visit: Admitting: Family Medicine

## 2025-03-19 ENCOUNTER — Encounter: Admitting: Family Medicine

## 2025-05-13 ENCOUNTER — Ambulatory Visit: Admitting: Dermatology
